# Patient Record
Sex: Female | Born: 1935
Health system: Southern US, Community
[De-identification: ages and names within clinical notes are randomized; demographics above are authoritative.]

## PROBLEM LIST (undated history)

## (undated) DIAGNOSIS — F32A Depression, unspecified: Secondary | ICD-10-CM

## (undated) DIAGNOSIS — F0390 Unspecified dementia without behavioral disturbance: Secondary | ICD-10-CM

## (undated) DIAGNOSIS — Z86711 Personal history of pulmonary embolism: Secondary | ICD-10-CM

## (undated) DIAGNOSIS — F419 Anxiety disorder, unspecified: Secondary | ICD-10-CM

## (undated) DIAGNOSIS — F329 Major depressive disorder, single episode, unspecified: Secondary | ICD-10-CM

## (undated) DIAGNOSIS — F05 Delirium due to known physiological condition: Secondary | ICD-10-CM

## (undated) DIAGNOSIS — E785 Hyperlipidemia, unspecified: Secondary | ICD-10-CM

## (undated) DIAGNOSIS — Z79899 Other long term (current) drug therapy: Secondary | ICD-10-CM

## (undated) DIAGNOSIS — M8580 Other specified disorders of bone density and structure, unspecified site: Secondary | ICD-10-CM

## (undated) DIAGNOSIS — D1803 Hemangioma of intra-abdominal structures: Secondary | ICD-10-CM

## (undated) DIAGNOSIS — I10 Essential (primary) hypertension: Secondary | ICD-10-CM

## (undated) DIAGNOSIS — R739 Hyperglycemia, unspecified: Secondary | ICD-10-CM

## (undated) DIAGNOSIS — K5790 Diverticulosis of intestine, part unspecified, without perforation or abscess without bleeding: Secondary | ICD-10-CM

## (undated) DIAGNOSIS — D649 Anemia, unspecified: Secondary | ICD-10-CM

## (undated) HISTORY — DX: Other long term (current) drug therapy: Z79.899

## (undated) HISTORY — DX: Diverticulosis of intestine, part unspecified, without perforation or abscess without bleeding: K57.90

## (undated) HISTORY — DX: Hemangioma of intra-abdominal structures: D18.03

## (undated) HISTORY — PX: OTHER SURGICAL HISTORY: SHX169

## (undated) HISTORY — DX: Depression, unspecified: F32.A

## (undated) HISTORY — PX: ABDOMINAL HYSTERECTOMY: SHX81

## (undated) HISTORY — DX: Other specified disorders of bone density and structure, unspecified site: M85.80

## (undated) HISTORY — DX: Hyperlipidemia, unspecified: E78.5

## (undated) HISTORY — DX: Essential (primary) hypertension: I10

## (undated) HISTORY — DX: Personal history of pulmonary embolism: Z86.711

## (undated) HISTORY — DX: Anemia, unspecified: D64.9

## (undated) HISTORY — DX: Major depressive disorder, single episode, unspecified: F32.9

---

## 2000-08-04 ENCOUNTER — Ambulatory Visit (HOSPITAL_COMMUNITY): Admission: RE | Admit: 2000-08-04 | Discharge: 2000-08-04 | Payer: Self-pay | Admitting: Gastroenterology

## 2002-07-21 ENCOUNTER — Encounter: Payer: Self-pay | Admitting: Emergency Medicine

## 2002-07-22 ENCOUNTER — Inpatient Hospital Stay (HOSPITAL_COMMUNITY): Admission: EM | Admit: 2002-07-22 | Discharge: 2002-07-23 | Payer: Self-pay | Admitting: Emergency Medicine

## 2003-10-08 DIAGNOSIS — M8580 Other specified disorders of bone density and structure, unspecified site: Secondary | ICD-10-CM

## 2003-10-08 HISTORY — DX: Other specified disorders of bone density and structure, unspecified site: M85.80

## 2003-11-17 DIAGNOSIS — Z86711 Personal history of pulmonary embolism: Secondary | ICD-10-CM

## 2003-11-17 HISTORY — DX: Personal history of pulmonary embolism: Z86.711

## 2004-09-05 ENCOUNTER — Emergency Department (HOSPITAL_COMMUNITY): Admission: EM | Admit: 2004-09-05 | Discharge: 2004-09-05 | Payer: Self-pay | Admitting: Emergency Medicine

## 2004-09-12 ENCOUNTER — Inpatient Hospital Stay (HOSPITAL_COMMUNITY): Admission: EM | Admit: 2004-09-12 | Discharge: 2004-09-15 | Payer: Self-pay | Admitting: Emergency Medicine

## 2005-04-14 ENCOUNTER — Emergency Department (HOSPITAL_COMMUNITY): Admission: EM | Admit: 2005-04-14 | Discharge: 2005-04-14 | Payer: Self-pay | Admitting: *Deleted

## 2005-06-15 ENCOUNTER — Ambulatory Visit: Payer: Self-pay | Admitting: Internal Medicine

## 2005-09-21 ENCOUNTER — Encounter (INDEPENDENT_AMBULATORY_CARE_PROVIDER_SITE_OTHER): Payer: Self-pay | Admitting: Internal Medicine

## 2005-09-22 ENCOUNTER — Ambulatory Visit: Payer: Self-pay | Admitting: Internal Medicine

## 2005-12-22 ENCOUNTER — Ambulatory Visit: Payer: Self-pay | Admitting: Internal Medicine

## 2006-01-01 ENCOUNTER — Encounter (INDEPENDENT_AMBULATORY_CARE_PROVIDER_SITE_OTHER): Payer: Self-pay | Admitting: Internal Medicine

## 2006-01-07 ENCOUNTER — Ambulatory Visit: Payer: Self-pay | Admitting: Internal Medicine

## 2006-09-23 DIAGNOSIS — M949 Disorder of cartilage, unspecified: Secondary | ICD-10-CM

## 2006-09-23 DIAGNOSIS — K5730 Diverticulosis of large intestine without perforation or abscess without bleeding: Secondary | ICD-10-CM | POA: Insufficient documentation

## 2006-09-23 DIAGNOSIS — M899 Disorder of bone, unspecified: Secondary | ICD-10-CM | POA: Insufficient documentation

## 2006-09-23 DIAGNOSIS — F329 Major depressive disorder, single episode, unspecified: Secondary | ICD-10-CM

## 2006-09-23 DIAGNOSIS — D1803 Hemangioma of intra-abdominal structures: Secondary | ICD-10-CM

## 2006-09-23 DIAGNOSIS — E785 Hyperlipidemia, unspecified: Secondary | ICD-10-CM | POA: Insufficient documentation

## 2006-09-23 DIAGNOSIS — D649 Anemia, unspecified: Secondary | ICD-10-CM

## 2006-09-23 DIAGNOSIS — Z86718 Personal history of other venous thrombosis and embolism: Secondary | ICD-10-CM | POA: Insufficient documentation

## 2006-09-23 DIAGNOSIS — I10 Essential (primary) hypertension: Secondary | ICD-10-CM | POA: Insufficient documentation

## 2007-08-18 ENCOUNTER — Emergency Department (HOSPITAL_COMMUNITY): Admission: EM | Admit: 2007-08-18 | Discharge: 2007-08-18 | Payer: Self-pay | Admitting: Emergency Medicine

## 2007-08-22 ENCOUNTER — Ambulatory Visit: Payer: Self-pay | Admitting: Orthopedic Surgery

## 2007-08-24 ENCOUNTER — Ambulatory Visit: Payer: Self-pay | Admitting: Orthopedic Surgery

## 2007-08-24 DIAGNOSIS — S52539A Colles' fracture of unspecified radius, initial encounter for closed fracture: Secondary | ICD-10-CM | POA: Insufficient documentation

## 2007-08-26 ENCOUNTER — Ambulatory Visit (HOSPITAL_COMMUNITY): Admission: RE | Admit: 2007-08-26 | Discharge: 2007-08-26 | Payer: Self-pay | Admitting: Orthopedic Surgery

## 2007-08-26 ENCOUNTER — Ambulatory Visit: Payer: Self-pay | Admitting: Orthopedic Surgery

## 2007-08-26 HISTORY — PX: OTHER SURGICAL HISTORY: SHX169

## 2007-08-30 ENCOUNTER — Ambulatory Visit: Payer: Self-pay | Admitting: Orthopedic Surgery

## 2007-09-06 ENCOUNTER — Ambulatory Visit: Payer: Self-pay | Admitting: Orthopedic Surgery

## 2007-09-06 DIAGNOSIS — M25539 Pain in unspecified wrist: Secondary | ICD-10-CM | POA: Insufficient documentation

## 2007-10-04 ENCOUNTER — Ambulatory Visit: Payer: Self-pay | Admitting: Orthopedic Surgery

## 2007-10-05 ENCOUNTER — Telehealth: Payer: Self-pay | Admitting: Orthopedic Surgery

## 2007-10-24 ENCOUNTER — Ambulatory Visit: Payer: Self-pay | Admitting: Orthopedic Surgery

## 2007-10-26 ENCOUNTER — Telehealth: Payer: Self-pay | Admitting: Orthopedic Surgery

## 2007-11-28 ENCOUNTER — Ambulatory Visit: Payer: Self-pay | Admitting: Orthopedic Surgery

## 2007-12-26 ENCOUNTER — Ambulatory Visit: Payer: Self-pay | Admitting: Orthopedic Surgery

## 2008-05-22 ENCOUNTER — Encounter: Admission: RE | Admit: 2008-05-22 | Discharge: 2008-05-22 | Payer: Self-pay | Admitting: Internal Medicine

## 2008-12-07 ENCOUNTER — Encounter: Payer: Self-pay | Admitting: Orthopedic Surgery

## 2008-12-07 ENCOUNTER — Emergency Department (HOSPITAL_COMMUNITY): Admission: EM | Admit: 2008-12-07 | Discharge: 2008-12-07 | Payer: Self-pay | Admitting: Emergency Medicine

## 2008-12-10 ENCOUNTER — Ambulatory Visit: Payer: Self-pay | Admitting: Orthopedic Surgery

## 2008-12-10 DIAGNOSIS — IMO0001 Reserved for inherently not codable concepts without codable children: Secondary | ICD-10-CM

## 2008-12-10 DIAGNOSIS — S52599A Other fractures of lower end of unspecified radius, initial encounter for closed fracture: Secondary | ICD-10-CM | POA: Insufficient documentation

## 2009-01-21 ENCOUNTER — Ambulatory Visit: Payer: Self-pay | Admitting: Orthopedic Surgery

## 2009-01-21 DIAGNOSIS — M25569 Pain in unspecified knee: Secondary | ICD-10-CM | POA: Insufficient documentation

## 2009-07-06 IMAGING — RF DG WRIST 2V*R*
1 series · 4 of 4 positions shown · non-contrast
Comparison: none

CLINICAL DATA: 81-year-old, right wrist fracture. 
 RIGHT WRIST ? 2 VIEW:

[Series 1161: run · 4 of 4 slices shown]
[im 1/4]
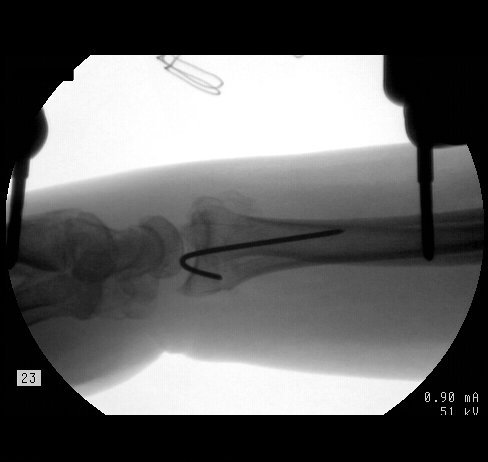
[im 2/4]
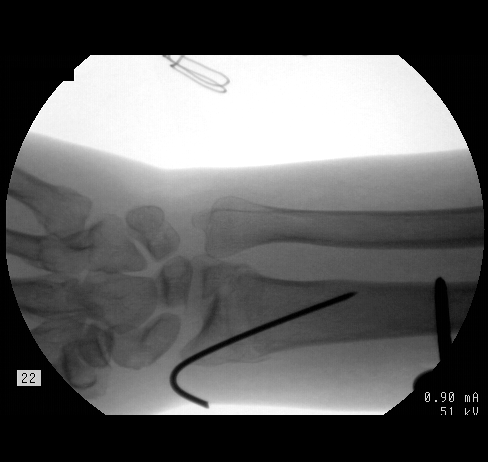
[im 3/4]
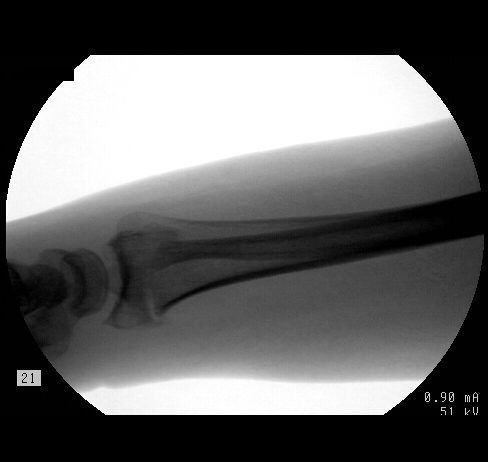
[im 4/4]
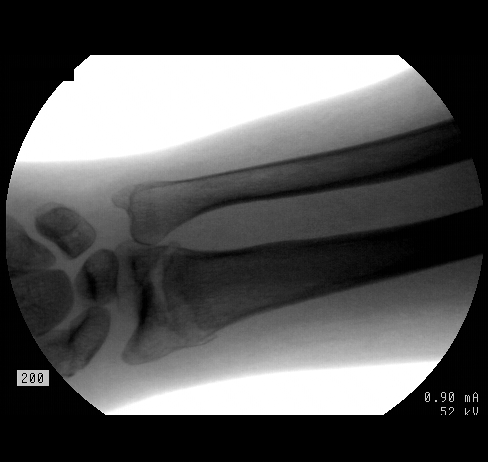

[4 of 4 positions shown; findings below may reference images not displayed]

FINDINGS: Four fluoroscopic spot images demonstrate placement of an external fixator with reduction of the distal radius fracture and subsequent placement of a smooth percutaneous pin through the radial styloid into the radial diaphysis.
IMPRESSION: External fixator in place with percutaneous pinning of a distal radius fracture.

## 2009-09-11 ENCOUNTER — Ambulatory Visit: Payer: Self-pay | Admitting: Orthopedic Surgery

## 2009-09-11 DIAGNOSIS — M1710 Unilateral primary osteoarthritis, unspecified knee: Secondary | ICD-10-CM

## 2009-12-16 ENCOUNTER — Ambulatory Visit: Payer: Self-pay | Admitting: Orthopedic Surgery

## 2010-04-16 ENCOUNTER — Ambulatory Visit: Payer: Self-pay | Admitting: Orthopedic Surgery

## 2010-08-07 ENCOUNTER — Ambulatory Visit: Payer: Self-pay | Admitting: Orthopedic Surgery

## 2010-11-06 ENCOUNTER — Ambulatory Visit: Payer: Self-pay | Admitting: Orthopedic Surgery

## 2010-12-04 ENCOUNTER — Emergency Department (HOSPITAL_COMMUNITY)
Admission: EM | Admit: 2010-12-04 | Discharge: 2010-12-04 | Payer: Self-pay | Source: Home / Self Care | Admitting: Emergency Medicine

## 2010-12-04 ENCOUNTER — Encounter: Payer: Self-pay | Admitting: Orthopedic Surgery

## 2010-12-08 ENCOUNTER — Ambulatory Visit
Admission: RE | Admit: 2010-12-08 | Discharge: 2010-12-08 | Payer: Self-pay | Source: Home / Self Care | Attending: Orthopedic Surgery | Admitting: Orthopedic Surgery

## 2010-12-08 ENCOUNTER — Encounter: Payer: Self-pay | Admitting: Orthopedic Surgery

## 2010-12-09 ENCOUNTER — Encounter (INDEPENDENT_AMBULATORY_CARE_PROVIDER_SITE_OTHER): Payer: Self-pay | Admitting: *Deleted

## 2010-12-10 LAB — BASIC METABOLIC PANEL
BUN: 17 mg/dL (ref 6–23)
CO2: 25 mEq/L (ref 19–32)
Chloride: 104 mEq/L (ref 96–112)
Creatinine, Ser: 0.92 mg/dL (ref 0.4–1.2)
Potassium: 3.9 mEq/L (ref 3.5–5.1)

## 2010-12-10 LAB — SURGICAL PCR SCREEN: MRSA, PCR: NEGATIVE

## 2010-12-12 ENCOUNTER — Ambulatory Visit (HOSPITAL_COMMUNITY)
Admission: RE | Admit: 2010-12-12 | Discharge: 2010-12-12 | Payer: Self-pay | Source: Home / Self Care | Attending: Orthopedic Surgery | Admitting: Orthopedic Surgery

## 2010-12-15 ENCOUNTER — Ambulatory Visit
Admission: RE | Admit: 2010-12-15 | Discharge: 2010-12-15 | Payer: Self-pay | Source: Home / Self Care | Attending: Orthopedic Surgery | Admitting: Orthopedic Surgery

## 2010-12-15 NOTE — Op Note (Addendum)
[name], [name]              ACCOUNT NO.:  [id number]  MEDICAL RECORD NO.:  [id number]          PATIENT TYPE:  AMB  LOCATION:  DAY                           FACILITY:  [facility]  PHYSICIAN:  Vickki Hearing, M.D.DATE OF BIRTH:  December 16, 1925  DATE OF PROCEDURE:  12/12/2010 DATE OF DISCHARGE:  12/12/2010                              OPERATIVE REPORT   DATE OF INJURY:  December 01, 2010, mechanism fall.  PREOPERATIVE DIAGNOSIS:  Closed fracture, right distal radius.  POSTOPERATIVE DIAGNOSIS:  Closed fracture, right distal radius.  PROCEDURE:  Open treatment and internal fixation, right distal radius.  SURGEON:  Vickki Hearing, MD  ASSISTED BY:  [place].  ANESTHETIC:  General.  OPERATIVE FINDINGS:  Previous malunited distal radius fracture, healed with shortening and loss of some of the volar tilt, refractured.  TOURNIQUET TIME:  80 minutes.  TOURNIQUET PRESSURE:  250 mmHg.  PROCEDURE IN DETAIL:  An 77 year old female identified in the preop area, right wrist marked as the surgical site and countersigned chart updated.  The patient taken to surgery, given a gram of Ancef, underwent general __________  intubation.  After prep and drape, time-out was executed.  Limb was then exsanguinated with a 4-inch Esmarch, tourniquet was elevated to 250 mmHg.  The arm was placed in supination.  The volar incision was made over the FCR.  Subcutaneous tissue was divided.  Flexor tendon sheath was incised.  FCR tendon was retracted.  Blunt dissection was carried out in the interval between the flexor of the thumb and the pronator teres.  Scar tissue was noted.  The fracture was noted, opened, cleaned, and then a reduction was performed.  A K-wire was used to stabilize reduction.  We did not attempt restoration of normal anatomy as there was too much contracture.  We treated her with reduction to her previous anatomy after this first fracture.  This left some shortening,  but gave reduction to the previous state.  Note, prior to the open reduction, a closed manipulation was performed and it was noted at that time that the fracture would not come back out to length and that the fracture had healed in a shortened position previously.  The DVR plate from the hand innovation set was used for fixation.  Contouring of the plate was made to account for the loss of volar tilt.  The 2/5 drill bit was used to place the first screw in the oblong hole and then starting on the ulnar side and proximal row.  The 2/0 drill bit was used to drill holes and then measuring device was placed.  The guides were removed and the screws were placed sequentially from proximal ulnar to distal radial.  This gave excellent fixation.  Radiographs confirmed reduction of the fracture to its previous state.  The proximal hole on the plate was then drilled off as well as the distal hole.  The most proximal of the shaft screw were removed, however.  Final radiographs confirmed reduction and adequate screw fixation. The forearm was rotated, the wrist was flexed and extended and the fracture fixation was stable.  Autograft bone was used after  irrigation in the fracture site.  A 2-0 Monocryl was used to repair the step cut in the brachioradialis which was released to assist in the reduction and then 2-0 Monocryl sutures were placed in the subcu followed by staples and injection of 20 mL of Marcaine with epinephrine.  A sugar-tong splint was applied and the patient was extubated and taken to recovery room in stable condition.  She is scheduled for followup on Monday.  She is discharged with: 1. Norco 5 mg with 325 mg of Tylenol 1-2 tablets q.4 p.r.n. for pain. 2. She is also on metoprolol 50 mg daily. 3. Simvastatin 20 mg daily. 4. Xanax 1 mg q. 4 as needed. 5. Losartan/hydrochlorothiazide 50/12.5 mg daily.     Vickki Hearing, M.D.     SEH/MEDQ  D:  12/12/2010  T:   12/13/2010  Job:  [id number]  Electronically Signed by Fuller Canada M.D. on 12/15/2010 04:43:32 PM

## 2010-12-15 NOTE — Discharge Summary (Addendum)
  [name], [name]              ACCOUNT NO.:  [id number]  MEDICAL RECORD NO.:  [id number]          PATIENT TYPE:  AMB  LOCATION:  DAY                           FACILITY:  [facility]  PHYSICIAN:  Vickki Hearing, M.D.DATE OF BIRTH:  September 07, 1926  DATE OF ADMISSION:  12/12/2010 DATE OF DISCHARGE:  LH                         DISCHARGE SUMMARY-REFERRING   CHIEF COMPLAINT:  Pain, right wrist.  HISTORY:  This is an 77 year old female with multiple medical problems with history of previous wrist fracture, left wrist, treated with cast, presents now with right wrist fracture, moderate to severe throbbing, aching pain associated with swelling, but no numbness or tingling, status post fall December 01, 2010.  X-rays were done at [facility] on the same day.  The patient presented to the office on December 08, 2010, and was scheduled for surgery.  The patient and her son were given the options of nonoperative treatment.  MEDICATIONS:  Include metoprolol, benazepril, simvastatin, hydrocodone 5 mg, currently not taking, Tylenol.  ALLERGIES:  ERYTHROMYCIN.  PAST MEDICAL HISTORY: 1. Depression. 2. Diverticulosis. 3. Hyperlipidemia. 4. Hypertension. 5. History of pulmonary embolism. 6. Hepatic hemangioma. 7. Normocytic anemia. 8. Osteopenia.  PAST SURGICAL HISTORY: 1. Hysterectomy. 2. Ten vaginal deliveries. 3. Fibroid tumor on the shoulder. 4. Right shoulder surgery. 5. Right closed reduction, percutaneous pinning, and external fixation     in October 2008.  FAMILY HISTORY:  Cancer and diabetes.  SOCIAL HISTORY:  The patient currently lives with her son.  MEDICAL REVIEW OF SYSTEMS:  Negative for cardiovascular, respiratory, GI, GU, neuro, endocrine, skin, ENT, immunologic, hematologic, or constitutional problems.  Denies nervousness, anxiety, or hallucinations.  Does complain of depression.  PHYSICAL EXAMINATION:  GENERAL:  Well-developed, well-nourished  female, normal grooming and hygiene.  No deformity and normal body habitus. MUSCULOSKELETAL:  Normal pulses.  No edema, no erythema, no tenderness. LYMPH NODES:  Normal. SKIN:  No rashes, no lesions, no open sores. NEUROLOGIC:  Normal coordination, reflexes, and sensation in her extremities.  She is awake, alert and oriented x3.  Mood and affect are normal.  Gait is slow and labored. EXTREMITIES:  Right upper extremity with deformity and swelling. Sensation is normal in the fingertips with normal color.  Arm is left in splint for comfort and pain control.  Lower extremities have mild deformities from arthritis.  Muscle tone is normal.  No joint subluxations are seen.  No swelling is seen. VITAL SIGNS:  Will be recorded on admission.  IMPRESSION:  X-rays were done at the hospital.  She has an impacted comminuted right distal radius fracture with some overriding and no evidence of dislocation.  PLAN:  Open treatment internal fixation, right wrist with a DVR volar plate.     Vickki Hearing, M.D.     SEH/MEDQ  D:  12/11/2010  T:  12/11/2010  Job:  [id number]  Electronically Signed by Fuller Canada M.D. on 12/15/2010 04:43:26 PM

## 2010-12-16 NOTE — Assessment & Plan Note (Signed)
Summary: BILAT KNEE PAIN/REQ'S INJEC/MAY NEED NEW XR'S/MEDICARE/CAF   Visit Type:  Follow-up Primary Provider:  Dr. Renae Gloss   CC:  bilateral knee pain.  History of Present Illness: I saw [name] in the office today for a 3 MONTH followup visit.  She is a 77 years old woman with the complaint of:  BILATERAL KNEES pain as she carries a diagnosis of osteoarthritis.  We have discussed surgery in the past but she would rather try other measures  Received injection right knee January of this year, today is requesting injections for both knees.  Meds: Simvastatin, Benazeprol/HCTZ, D3, Metoprolol, Xanax  Right knee injection lasted 3 months.  2 injections were given one in each knee using the following technique:  Verbal consent was obtained. The knee was prepped with alcohol and ethyl chloride. 1 cc of depomedrol 40mg /cc and 4 cc of lidocaine 1% was injected. there were no complications.    Allergies: 1)  ! Erythromycin   Impression & Recommendations:  Problem # 1:  KNEE, ARTHRITIS, DEGEN./OSTEO (ICD-715.96) Assessment Deteriorated  Her updated medication list for this problem includes:    Aspirin 81 Mg Tbec (Aspirin) .Marland Kitchen... Take 1 tablet by mouth once a day    Vicodin 5-500 Mg Tabs (Hydrocodone-acetaminophen)    Norco 5-325 Mg Tabs (Hydrocodone-acetaminophen) .Marland Kitchen... 1 by mouth q4 as needed    Tylenol With Codeine #3 300-30 Mg Tabs (Acetaminophen-codeine) .Marland Kitchen... 1 q 6 as needed pain  Orders: Joint Aspirate / Injection, Large (20610) Depo- Medrol 40mg  (J1030)  Medications Added to Medication List This Visit: 1)  Tylenol With Codeine #3 300-30 Mg Tabs (Acetaminophen-codeine) .Marland Kitchen.. 1 q 6 as needed pain  Patient Instructions: 1)  You have received an injection of cortisone today. You may experience increased pain at the injection site. Apply ice pack to the area for 20 minutes every 2 hours and take 2 xtra strength tylenol every 8 hours. This increased pain will usually resolve  in 24 hours. The injection will take effect in 3-10 days.  2)  we've added braces and pain medication as needed  3)  return in 3 months  Prescriptions: TYLENOL WITH CODEINE #3 300-30 MG TABS (ACETAMINOPHEN-CODEINE) 1 Q 6 as needed PAIN  #56 x 5   Entered and Authorized by:   Fuller Canada MD   Signed by:   Fuller Canada MD on 04/16/2010   Method used:   Print then Give to Patient   RxID:   [id number]

## 2010-12-16 NOTE — Assessment & Plan Note (Signed)
Summary: 3 MO FOL/UP AFTER BRACES & INJECTION/MEDICARE/BSF   Visit Type:  Follow-up Primary Provider:  Dr. Renae Gloss   CC:  bilateral knee pain.  History of Present Illness: I saw [name] in the office today for a 3 month followup visit.  She is a 77 years old woman with the complaint of:  bilateral knee pain   Follow up after injections and braces.  Meds: Simvastatin, Benazeprol/HCTZ, D3, Metoprolol, Xanax, Tylenol With Codeine #3 300-30 Mg Tabs (Acetaminophen-codeine) .Marland Kitchen.. 1 q 6 as needed pain.   She would like to have repeat injections in both knees, seem to help pretty well last time  Bilateral knee joint injections were done Verbal consent was obtained. The knee was prepped with alcohol and ethyl chloride. 1 cc of depomedrol 40mg /cc and 4 cc of lidocaine 1% was injected. there were no complications.- L   Verbal consent was obtained. The knee was prepped with alcohol and ethyl chloride. 1 cc of depomedrol 40mg /cc and 4 cc of lidocaine 1% was injected. there were no complications. -R   Allergies: 1)  ! Erythromycin   Impression & Recommendations:  Problem # 1:  KNEE, ARTHRITIS, DEGEN./OSTEO (ICD-715.96) Assessment Deteriorated  Her updated medication list for this problem includes:    Aspirin 81 Mg Tbec (Aspirin) .Marland Kitchen... Take 1 tablet by mouth once a day    Vicodin 5-500 Mg Tabs (Hydrocodone-acetaminophen)    Norco 5-325 Mg Tabs (Hydrocodone-acetaminophen) .Marland Kitchen... 1 by mouth q4 as needed    Tylenol With Codeine #3 300-30 Mg Tabs (Acetaminophen-codeine) .Marland Kitchen... 1 q 6 as needed pain  Orders: Joint Aspirate / Injection, Large (20610) Depo- Medrol 40mg  (J1030)  Problem # 2:  KNEE PAIN (JYN-829.56) Assessment: Deteriorated  Her updated medication list for this problem includes:    Aspirin 81 Mg Tbec (Aspirin) .Marland Kitchen... Take 1 tablet by mouth once a day    Vicodin 5-500 Mg Tabs (Hydrocodone-acetaminophen)    Norco 5-325 Mg Tabs (Hydrocodone-acetaminophen) .Marland Kitchen... 1 by mouth q4  as needed    Tylenol With Codeine #3 300-30 Mg Tabs (Acetaminophen-codeine) .Marland Kitchen... 1 q 6 as needed pain  Orders: Joint Aspirate / Injection, Large (20610) Depo- Medrol 40mg  (J1030)  Patient Instructions: 1)  Please schedule a follow-up appointment in 3 months. 2)  You have received an injection of cortisone today. You may experience increased pain at the injection site. Apply ice pack to the area for 20 minutes every 2 hours and take 2 xtra strength tylenol every 8 hours. This increased pain will usually resolve in 24 hours. The injection will take effect in 3-10 days.

## 2010-12-16 NOTE — Assessment & Plan Note (Signed)
Summary: 3 M RE-CK KNEES/POSS INJEC/MEDICARE/CAF   Visit Type:  Follow-up  CC:  bilateral knees.  History of Present Illness: I saw [name] in the office today for a 3 MONTH followup visit.  She is a 77 years old woman with the complaint of:  BILATERAL KNEES.  Bilateral osteoarthritis of the knee received an injection last time had an x-ray in March of 2010 of both knees.  Comes in today with her RIGHT knee hurting.  She had a giving out episode.  The strength in the RIGHT knee however on extension testing is normal, there is no swelling there is some medial and lateral joint line tenderness  Impression pain from osteoarthritis plan injection RIGHT knee  Knee injection Verbal consent was obtained. The knee was prepped with alcohol and ethyl chloride. 1 cc of depomedrol 40mg /cc and 4 cc of lidocaine 1% was injected. there were no complications.      Allergies: 1)  ! Erythromycin   Impression & Recommendations:  Problem # 1:  KNEE, ARTHRITIS, DEGEN./OSTEO (ICD-715.96) Assessment Deteriorated  Her updated medication list for this problem includes:    Aspirin 81 Mg Tbec (Aspirin) .Marland Kitchen... Take 1 tablet by mouth once a day    Vicodin 5-500 Mg Tabs (Hydrocodone-acetaminophen)    Norco 5-325 Mg Tabs (Hydrocodone-acetaminophen) .Marland Kitchen... 1 by mouth q4 as needed  Orders: Est. Patient Level II (16109) Joint Aspirate / Injection, Large (20610) Depo- Medrol 40mg  (J1030)  Problem # 2:  KNEE PAIN (UEA-540.98) Assessment: Deteriorated  Her updated medication list for this problem includes:    Aspirin 81 Mg Tbec (Aspirin) .Marland Kitchen... Take 1 tablet by mouth once a day    Vicodin 5-500 Mg Tabs (Hydrocodone-acetaminophen)    Norco 5-325 Mg Tabs (Hydrocodone-acetaminophen) .Marland Kitchen... 1 by mouth q4 as needed  Orders: Est. Patient Level II (11914)  Patient Instructions: 1)  You have received an injection of cortisone today. You may experience increased pain at the injection site. Apply ice pack  to the area for 20 minutes every 2 hours and take 2 xtra strength tylenol every 8 hours. This increased pain will usually resolve in 24 hours. The injection will take effect in 3-10 days.  2)  Please schedule a follow-up appointment as needed.

## 2010-12-18 NOTE — Assessment & Plan Note (Signed)
Summary: 3 M RE-CK KNEES/POSS INJEC/MEDICARE/CAF  CC: recheck knees   Visit Type:  Follow-up Primary [name]:  Dr. Renae Gloss   CC:  recheck knees.  History of Present Illness: I saw [name] in the office today for a 3 month followup visit.  She is a 77 years old woman with the complaint of:  bilateral knee pain   Follow up after injections and braces.  Meds: Simvastatin, Benazeprol/HCTZ, D3, Metoprolol, Xanax, Tylenol With Codeine #3 300-30 Mg Tabs (Acetaminophen-codeine) .Marland Kitchen.. 1 q 6 as needed pain.  [id number] bilateral knees.  Treatment injections, and braces.  Medications as above.  Complaints 4/10 pain today scheduled for repeat injections       Allergies: 1)  ! Erythromycin   Impression & Recommendations:  Problem # 1:  KNEE, ARTHRITIS, DEGEN./OSTEO (JYN-829.56) Assessment Comment Only  Verbal consent was obtained. The left knee and then the right knee was prepped with alcohol and ethyl chloride. 1 cc of depomedrol 40mg /cc and 4 cc of lidocaine 1% was injected. there were no complications.  Her updated medication list for this problem includes:    Aspirin 81 Mg Tbec (Aspirin) .Marland Kitchen... Take 1 tablet by mouth once a day    Vicodin 5-500 Mg Tabs (Hydrocodone-acetaminophen)    Norco 5-325 Mg Tabs (Hydrocodone-acetaminophen) .Marland Kitchen... 1 by mouth q4 as needed    Tylenol With Codeine #3 300-30 Mg Tabs (Acetaminophen-codeine) .Marland Kitchen... 1 q 6 as needed pain  Orders: Joint Aspirate / Injection, Large (20610) Depo- Medrol 40mg  (J1030)  Patient Instructions: 1)  You have received an injection of cortisone today. You may experience increased pain at the injection site. Apply ice pack to the area for 20 minutes every 2 hours and take 2 xtra strength tylenol every 8 hours. This increased pain will usually resolve in 24 hours. The injection will take effect in 3-10 days.  2)  Please schedule a follow-up appointment in 3 months. Prescriptions: TYLENOL WITH CODEINE #3  300-30 MG TABS (ACETAMINOPHEN-CODEINE) 1 Q 6 as needed PAIN  #84 x 3   Entered and Authorized by:   Fuller Canada MD   Signed by:   Fuller Canada MD on 11/06/2010   Method used:   Print then Give to Patient   RxID:   [id number]    Orders Added: 1)  Joint Aspirate / Injection, Large [20610] 2)  Depo- Medrol 40mg  [J1030]

## 2010-12-18 NOTE — Letter (Signed)
Summary: surgery order RTwrist scheduled 12/12/10  surgery order RTwrist scheduled 12/12/10   Imported By: Cammie Sickle 12/09/2010 19:54:37  _____________________________________________________________________  External Attachment:    Type:   Image     Comment:   External Document

## 2010-12-18 NOTE — Miscellaneous (Signed)
Summary: No pre-authorization required for out-patient surgery  Clinical Lists Changes  RE: Outpatient surgery RT wrist (distal radiius) scheduled 12/12/10 at [facility], no pre-authorization required per Medicare guidelines.

## 2010-12-18 NOTE — Letter (Signed)
Summary: History form  History form   Imported By: Jacklynn Ganong 12/09/2010 14:24:00  _____________________________________________________________________  External Attachment:    Type:   Image     Comment:   External Document

## 2010-12-18 NOTE — Assessment & Plan Note (Signed)
Summary: NEW PROB/[facility] ER FOL/UP FX RT WRIST/XRAY [facility] 12/04/10/MEDICARE/CAF   Visit Type:  new problem Referring Provider:  [facility] Primary Provider:  Dr. Renae Gloss   CC:  right wrist fracture.  History of Present Illness: I saw [name] in the office today for an initial visit.  She is a 77 years old woman with the complaint of:  right wrist fracture  DOI 12-01-10. Fall.  Xrays at [facility] on 12-04-10  Medications: Metoprolol, Benazepril, Simvastatin, Hydrocodone 5 mg. from ER, did not fill because could not afford, has been taking Tylenol.  Allergies: 1)  ! Erythromycin  Past History:  Past Medical History: Last updated: 09/23/2006 Depression Diverticulosis, colon Hyperlipidemia Hypertension Pulmonary embolism, hx of Polypharmacy Hepatic hemangioma Normocytic anemia Osteopenia (DXA 10/08/2003)  Past Surgical History: Last updated: 08/30/2007 Hysterectomy Vaginal deliveries x 10 Fibroid tumor on shoulder RT Arm/Shoulder surgery RT Closed Reduction Percutaneous Pins Ext Fixator 08/26/07-Dr Romeo Apple  Family History: FH of Cancer:  Family History of Diabetes  Social History: lives with her son   Review of Systems Psychiatric:  Complains of depression; denies nervousness, anxiety, and hallucinations.  The review of systems is negative for Constitutional, Cardiovascular, Respiratory, Gastrointestinal, Genitourinary, Neurologic, Musculoskeletal, Endocrine, Skin, HEENT, Immunology, and Hemoatologic.  Physical Exam  Additional Exam:  GEN: well developed, well nourished, normal grooming and hygiene, no deformity and normal body habitus.   CDV: pulses are normal, no edema, no erythema. no tenderness  Lymph: normal lymph nodes   Skin: no rashes, skin lesions or open sores   NEURO: normal coordination, reflexes, sensation.   Psyche: awake, alert and oriented. Mood normal   Gait: slow and labored   Inspection: RIGHT upper extremity, deformity with swelling. We  cannot test range of motion and strength and stability of the wrist joint. The splint was Done to stabilize the fracture.  Sensation was normal and the fingertips. Color was normal in the fingertips.  Her lower extremities have mild deformities from osteoarthritis. Muscle tone is normal. No joint subluxations are seen. No swelling is seen.      Impression & Recommendations:  Problem # 1:  FRACTURE, RADIUS, DISTAL (ICD-813.42) Assessment New The x-rays were done at [facility]. The report and the films have been reviewed. read as show Impaction type fracture of the distal right radius with some override, no evidence of dislocation.  However, I think it shows more of a proximal impaction of the metaphyseal diaphyseal region of the distal radius with shortening. Some dorsal shift of about 50%. No real articular surface angulation. There may be an intra-articular fragment or fracture line, just at the radial styloid. Radial inclination. Also has been lost. This will most likely require some type of surgical treatment. If the family agrees to that.   Informed consent process: I have discussed the procedure with the patient. I have answered their questions. The risks of bleeding, infection, nerve and vascualr injury have been discussed. The diagnosis and reason for surgery have been explained. The patient demonstrates understanding of this discussion. Specific to this procedure risks include: failure of fixation, wrist stiffness, wrist, pain, especially on the ulnar side of the joint. Offered surgery vs internal fixation she chose surgery with her son's approval  OTIF with volar plate (DVR) right wrist   Other Orders: Est. Patient Level IV (69629)   Orders Added: 1)  Est. Patient Level IV [52841]

## 2010-12-24 ENCOUNTER — Encounter: Payer: Self-pay | Admitting: Orthopedic Surgery

## 2010-12-24 ENCOUNTER — Ambulatory Visit (INDEPENDENT_AMBULATORY_CARE_PROVIDER_SITE_OTHER): Payer: Medicare Other | Admitting: Orthopedic Surgery

## 2010-12-24 DIAGNOSIS — S52599A Other fractures of lower end of unspecified radius, initial encounter for closed fracture: Secondary | ICD-10-CM

## 2010-12-24 DIAGNOSIS — S42309D Unspecified fracture of shaft of humerus, unspecified arm, subsequent encounter for fracture with routine healing: Secondary | ICD-10-CM

## 2010-12-24 NOTE — Assessment & Plan Note (Signed)
Summary: POST OP 1 RT WRIST1/19/12 MEDICARE/[facility]   Visit Type:  post op Referring Provider:  [facility] Primary Provider:  Dr. Renae Gloss   CC:  post op right wrist.  History of Present Illness: I saw [name] in the office today for a followup visit.  She is a 77 years old woman with the complaint of:  post op right wrist.  Post op #1  DOS 12-12-10.  Procedure: Open treatment and internal fixation, right distal radius.  Medications: Norco 5 mg  Complaints: She is having pain, pain medicine is helping some. She wants a shot in her knee.  Her RIGHT upper extremity. Incision looks very nice. Her swelling is minimal. She is placed in a volar cock-up splint.  I also placed a shot in her RIGHT knee.    Allergies: 1)  ! Erythromycin   Impression & Recommendations:  Problem # 1:  FRACTURE, RADIUS, DISTAL (ICD-813.42)  Orders: Post-Op Check (57846)  Problem # 2:  AFTERCARE HEALING TRAUMATIC FRACTURE ARM UNSPEC (ICD-V54.10)  Orders: Post-Op Check (96295)  Problem # 3:  KNEE, ARTHRITIS, DEGEN./OSTEO (ICD-715.96)  Her updated medication list for this problem includes:    Aspirin 81 Mg Tbec (Aspirin) .Marland Kitchen... Take 1 tablet by mouth once a day    Vicodin 5-500 Mg Tabs (Hydrocodone-acetaminophen)    Norco 5-325 Mg Tabs (Hydrocodone-acetaminophen) .Marland Kitchen... 1 by mouth q4 as needed    Tylenol With Codeine #3 300-30 Mg Tabs (Acetaminophen-codeine) .Marland Kitchen... 1 q 6 as needed pain  Verbal consent was obtained. The RIGHT knee was prepped with alcohol and ethyl chloride. 1 cc of depomedrol 40mg /cc and 4 cc of lidocaine 1% was injected. there were no complications.  Orders: Joint Aspirate / Injection, Large (20610) Depo- Medrol 40mg  (M8413)  Patient Instructions: 1)  Feb 7, 0r 8 for staples out and xrays    Orders Added: 1)  Post-Op Check [99024] 2)  Joint Aspirate / Injection, Large [20610] 3)  Depo- Medrol 40mg  [J1030]

## 2011-01-01 NOTE — Assessment & Plan Note (Signed)
Summary: staples out/xr  post op # 2 surg 12/12/10/mcr/bsf   Visit Type:  Follow-up Referring Provider:  [facility] Primary Provider:  Dr. Renae Gloss   CC:  post op wrist.  History of Present Illness: I saw [name] in the office today for a followup visit.  She is a 77 years old woman with the complaint of:  post op right wrist.  Post op visit #2, this is postoperative day #12  DOS 12-12-10.    Procedure: Open treatment and internal fixation, right distal radius.  Medications: Norco 5 mg, needs refill, helps.  Today is here for recheck and removes staples right wrist with xrays.  No new injuries.        Allergies: 1)  ! Erythromycin  Physical Exam  Additional Exam:   RIGHT wrist examination the incision looks great.  She has minimal swelling in her hand.  She did move all her fingers.     Impression & Recommendations:  Problem # 1:  AFTERCARE HEALING TRAUMATIC FRACTURE ARM UNSPEC (ICD-V54.10) Assessment Improved  Orders: Post-Op Check (93235) Wrist x-ray complete, minimum 3 views (57322) Short Arm Cast Elbow to Finger (02542)  Problem # 2:  FRACTURE, RADIUS, DISTAL (ICD-813.42) Assessment: Improved  x-rays were taken today of the RIGHT wrist  Fixation was in good position fracture is in good alignment  Recommend short arm cast which was applied in the office  Note on x-ray the patient had settling from a previous distal radius fracture which led to ulnar positive variance which is chronic.  Orders: Post-Op Check (70623) Wrist x-ray complete, minimum 3 views (76283) Short Arm Cast Elbow to Finger (15176)  Patient Instructions: 1)  Please schedule a follow-up appointment in 1 month. 2)  xr oop   Orders Added: 1)  Post-Op Check [99024] 2)  Wrist x-ray complete, minimum 3 views [73110] 3)  Short Arm Cast Elbow to Finger [29075]

## 2011-01-27 ENCOUNTER — Encounter: Payer: Self-pay | Admitting: Orthopedic Surgery

## 2011-01-27 ENCOUNTER — Ambulatory Visit (INDEPENDENT_AMBULATORY_CARE_PROVIDER_SITE_OTHER): Payer: Medicare Other | Admitting: Orthopedic Surgery

## 2011-01-27 DIAGNOSIS — S52599A Other fractures of lower end of unspecified radius, initial encounter for closed fracture: Secondary | ICD-10-CM

## 2011-01-27 DIAGNOSIS — S42309D Unspecified fracture of shaft of humerus, unspecified arm, subsequent encounter for fracture with routine healing: Secondary | ICD-10-CM

## 2011-02-03 NOTE — Assessment & Plan Note (Signed)
Summary: 1 m RE-CK/XRAY RT WRIST/MEDICARE/CAF   Visit Type:  Follow-up Referring Provider:  [facility] Primary Provider:  Dr. Renae Gloss   CC:  post op wrist.  History of Present Illness: I saw [name] in the office today for a followup visit.  She is a 77 years old woman with the complaint of:  post op right wrist.  Post op visit #3, this is postoperative day #12  DOS 12-12-10.  Procedure: Open treatment and internal fixation, right distal radius.  Medications: Norco 5 mg  Today is here for 1 month recheck XRAYS OOP    The patient's incision looks good.  She can open and close her hand with very little compromise and range of motion.  Passive range of motion of the wrist 20 extension 15 flexion  X-rays were obtained and show that the fracture is healing in the appropriate position and the hardware is stable  Recommend cockup splint home exercise program and a 6 week x-ray            Allergies: 1)  ! Erythromycin   Impression & Recommendations:  Problem # 1:  AFTERCARE HEALING TRAUMATIC FRACTURE ARM UNSPEC (ICD-V54.10)  separately identifiable x-ray report AP lateral modified and oblique RIGHT wrist  Plate fixation noted overall alignment acceptable with slight shortening noted from a previous fracture.  Hardware is in good position  Impression fracture healing and progression toward healing is noted in the RIGHT wrist  Orders: Post-Op Check (04540)  Problem # 2:  FRACTURE, RADIUS, DISTAL (ICD-813.42)  Orders: Post-Op Check (98119) Wrist x-ray complete, minimum 3 views (14782)  Patient Instructions: 1)  three times a day open and close the hand and move the wrist up and down  2)  x-rays left wrist in 6 weeks   Orders Added: 1)  Post-Op Check [99024] 2)  Wrist x-ray complete, minimum 3 views [73110]

## 2011-02-04 ENCOUNTER — Encounter: Payer: Self-pay | Admitting: Orthopedic Surgery

## 2011-02-11 ENCOUNTER — Ambulatory Visit (INDEPENDENT_AMBULATORY_CARE_PROVIDER_SITE_OTHER): Payer: Medicare Other | Admitting: Orthopedic Surgery

## 2011-02-11 DIAGNOSIS — M1710 Unilateral primary osteoarthritis, unspecified knee: Secondary | ICD-10-CM

## 2011-02-11 DIAGNOSIS — M25569 Pain in unspecified knee: Secondary | ICD-10-CM

## 2011-02-11 MED ORDER — HYDROCODONE-ACETAMINOPHEN 5-325 MG PO TABS: 1.0000 | ORAL_TABLET | ORAL | Status: DC | PRN

## 2011-02-12 MED ORDER — METHYLPREDNISOLONE ACETATE 40 MG/ML IJ SUSP: 40.0000 mg | Freq: Once | INTRAMUSCULAR | Status: DC

## 2011-02-12 NOTE — Progress Notes (Signed)
Injection LEFT knee.  Consent was obtained.  Time out was taken   LEFT knee was injected with Depo-Medrol 40 mg plus lidocaine 1% 4 cc.  Knee was prepped with alcohol and anesthetized with ethyl chloride.  The injection was tolerated without complication.    Verbal consent was obtained   Time out completed   Under sterile conditions the right knee was injected with Depomedrol 40 mg / ml (1 ml) and lidocaine 1% (4 ml)  There were no complications    

## 2011-03-02 LAB — DIFFERENTIAL
Eosinophils Absolute: 0 10*3/uL (ref 0.0–0.7)
Eosinophils Relative: 0 % (ref 0–5)
Lymphocytes Relative: 15 % (ref 12–46)
Lymphs Abs: 1.4 10*3/uL (ref 0.7–4.0)
Monocytes Absolute: 0.9 10*3/uL (ref 0.1–1.0)

## 2011-03-02 LAB — BASIC METABOLIC PANEL
BUN: 20 mg/dL (ref 6–23)
Chloride: 106 mEq/L (ref 96–112)
GFR calc non Af Amer: >60 mL/min (ref >60)
Glucose, Bld: 85 mg/dL (ref 70–99)
Potassium: 3.7 mEq/L (ref 3.5–5.1)
Sodium: 141 mEq/L (ref 135–145)

## 2011-03-02 LAB — CBC
HCT: 32.1 % — ABNORMAL LOW (ref 36.0–46.0)
Hemoglobin: 10.6 g/dL — ABNORMAL LOW (ref 12.0–15.0)
MCV: 91.8 fL (ref 78.0–100.0)
Platelets: 199 10*3/uL (ref 150–400)
WBC: 9.1 10*3/uL (ref 4.0–10.5)

## 2011-03-10 ENCOUNTER — Ambulatory Visit (INDEPENDENT_AMBULATORY_CARE_PROVIDER_SITE_OTHER): Payer: Medicare Other | Admitting: Orthopedic Surgery

## 2011-03-10 DIAGNOSIS — S52599A Other fractures of lower end of unspecified radius, initial encounter for closed fracture: Secondary | ICD-10-CM

## 2011-03-10 DIAGNOSIS — S52509A Unspecified fracture of the lower end of unspecified radius, initial encounter for closed fracture: Secondary | ICD-10-CM

## 2011-03-10 NOTE — Progress Notes (Signed)
Separate identifiable x-ray report AP lateral and oblique x-rays of the RIGHT wrist for fracture followup  There is deformity of the wrist is chronic from her previous fracture.  The plate is in good position the fracture is healed in good position with ulnar prominence.

## 2011-03-10 NOTE — Progress Notes (Signed)
The patient is doing well.  She has good motion of her hand and wrist she has ulnar-positive variance.  X-rays show healing of the fracture.  Patient is discharged in terms of the wrist

## 2011-03-31 NOTE — Op Note (Signed)
[name], [name]              ACCOUNT NO.:  [id number]   MEDICAL RECORD NO.:  [id number]          PATIENT TYPE:  AMB   LOCATION:  DAY                           FACILITY:  [facility]   PHYSICIAN:  Vickki Hearing, M.D.DATE OF BIRTH:  07/29/1926   DATE OF PROCEDURE:  08/26/2007  DATE OF DISCHARGE:  08/26/2007                               OPERATIVE REPORT   PREOPERATIVE DIAGNOSIS:  Fracture of the right distal radius.   POSTOPERATIVE DIAGNOSIS:  Fracture of the right distal radius.   PROCEDURE:  Closed reduction, external fixation and percutaneous pinning  of the right distal radius.   SURGEON:  Vickki Hearing, M.D.   ANESTHETIC:  By block.   OPERATIVE FINDINGS:  A displaced distal radius fracture.   After proper identification of the patient, she was brought to the  operating room and her right arm was blocked via Bier technique.  Sterile prep and drape was performed.  Time-out was completed.   Closed reduction was done by manipulation under C-arm guidance and the  patient's arm was placed in the pronated position.  Percutaneous pinning  was performed.  We then made an incision over the second metacarpal as  well to distal third radius, divided subcutaneous tissue bluntly down to  bone, applied to half pins, checked them with the C-arm and found them  to be in good position.  We then distracted the wrist and applied the  fixator, checked AP and lateral films, found the fracture to be reduced  in good position.  We closed the incisions with 3-0 nylon, dressed the  arm sterilely and then released the tourniquet.  The patient was taken  to the recovery room in stable condition.      Vickki Hearing, M.D.  Electronically Signed     SEH/MEDQ  D:  09/14/2007  T:  09/15/2007  Job:  [id number]

## 2011-03-31 NOTE — H&P (Signed)
[name], MOW              ACCOUNT NO.:  [id number]   MEDICAL RECORD NO.:  [id number]          PATIENT TYPE:  AMB   LOCATION:  DAY                           FACILITY:  [facility]   PHYSICIAN:  Vickki Hearing, M.D.DATE OF BIRTH:  1926/11/05   DATE OF ADMISSION:  DATE OF DISCHARGE:  LH                              HISTORY & PHYSICAL   CHIEF COMPLAINT:  Right wrist pain, date of injury -- October 2.  The  patient complains of constant aching, throbbing pain in the right wrist.  She has a distal radius fracture; she needs surgery.   HISTORY OF PRESENT ILLNESS:  The patient has a significant medical  history.  She has had a hysterectomy.  She has had a fibroid tumor  removed from her shoulder.  She had surgery on her right arm/shoulder.  She has had a stent placed in her vena cava.  She has a history of heart  palpitations, hypertension, diverticulitis.  I have spoken to her  doctor, Dr. Renae Gloss, at [phone] and she has indicated the patient  should be able to have surgery.   FAMILY HISTORY:  The patient has a family history of arthritis, cancer  and diabetes.   SOCIAL HISTORY:  She is widowed.  She does not smoke or drink.   REVIEW OF SYSTEMS:  She does become short of breath with activity, no  chest pain.  She does have 4-pillow orthopnea.  She has GI nausea.  NEUROLOGIC:  Dizziness, tremor, unsteady gait.  MUSCULOSKELETAL:  Rheumatoid arthritis and joint pain.  PSYCHIATRIC:  Depression.  She has  poor vision.  She denies weight loss, kidney disease, thyroid problems,  goiter, diabetes, skin cancer, eczema or itching.  She denies allergies.   ALLERGIES:  She is allergic to ERYTHROMYCIN.   EDUCATIONAL LEVEL:  Grade 6.   PHYSICAL EXAMINATION:  VITAL SIGNS:  Weight is 151.  Pulse is 62,  respiratory rate is 18.  GENERAL:  Appearance was normal.  CARDIOVASCULAR:  Observation and palpation normal.  SKIN:  Normal.  NEUROLOGIC:  She was awake, alert and oriented x3.  Mood and  affect  normal.  Sensation normal.  EXTREMITIES:  Right upper extremity was in a splint.  Fingers could move  and wiggle fine, good capillary refill.   RADIOLOGIC FINDINGS:  X-rays show a comminuted, impacted distal radius  fracture, right wrist.   PLAN:  Closed reduction, external fixation and percutaneous pinning.   The patient understands she will wear the fixator for 8 weeks.  We  discussed the risks and benefits of the procedure as well as its  complications.   MEDICATIONS:  1. Alprazolam 1 mg.  2. Simvastatin 20 mg.  3. Benazepril/hydrochlorothiazide 10 mg.  4. Metoprolol 50 mg.  5. Patanol for the eye 0.1% ophthalmic solution.  6. Pataday 0.2% eye ophthalmic solution.  7. She takes Vicodin for pain.      Vickki Hearing, M.D.  Electronically Signed     SEH/MEDQ  D:  08/25/2007  T:  08/26/2007  Job:  [id number]   cc:   [facility]

## 2011-04-03 NOTE — H&P (Signed)
[name], [name]                          ACCOUNT NO.:  [id number]   MEDICAL RECORD NO.:  [id number]                   PATIENT TYPE:  INP   LOCATION:  3037                                 FACILITY:  [facility]   PHYSICIAN:  Wilson Singer, M.D.             DATE OF BIRTH:  [birth year]   DATE OF ADMISSION:  07/21/2002  DATE OF DISCHARGE:  07/23/2002                                HISTORY & PHYSICAL   HISTORY OF PRESENT ILLNESS:  This is a 77 year old lady who has a background  history of hypertension and hypercholesterolemia who now presents with  slurred speech and difficulty with walking.  She noticed at approximately 10  hours ago, after she had eaten at [facility], she had slurred  speech and since this time has had difficulty with gait.  However, on closer  questioning she says that although the speech problem is new, the difficulty  with walking has been present for the last two weeks.  Today, she is unable  to walk because she feels unsteady and tends to vere to one side or the  other, although she cannot be specific to which side.  She has had no double  vision or blurred vision.  She has had no specific focal limb weakness.  She  has not loss consciousness, although I was told by the family today when she  was attended to by the emergency medical technicians that she was in a very  drowsy state.  She was prescribed more Xanax medication yesterday by her  primary care physician, Dr. Andi Carlson, to refill on previous Xanax  medications.  She denies taking any Xanax medications today.  She normally  takes Xanax on a daily basis.   MEDICATIONS:  1. Lotensin HCT one tablet q.d.  2. Lopressor 25 mg b.i.d.  3. Lipitor 20 mg q.d.  4. Xanax, dose unknown, q.d.   ALLERGIES:  ERYTHROMYCIN.   SOCIAL HISTORY:  She is married and lives with her husband and two other  children.  She does not smoke and does not drink excessively.   FAMILY HISTORY:   Noncontributory.   REVIEW OF SYMPTOMS:  Apart from the symptoms mentioned above, there are no  other symptoms referable to the cardiovascular, respiratory,  gastrointestinal, musculoskeletal, dermatological, psychiatric, endocrine or  hematologic systems.   PHYSICAL EXAMINATION:  GENERAL:  She looks well.  She does not look toxic.  She is not clinically pale.  VITAL SIGNS:  Blood pressure 134/69, pulse 78 in sinus rhythm, respirations  12, temperature 97.5.  CARDIOVASCULAR:  Heart sounds are present.  No carotid bruits.  Jugular  venous pressure is not raised.  LUNGS:  Clear to auscultation and percussion.  Breath sounds are clear.  There are no crackles or wheezes.  ABDOMEN:  Soft, nontender with no hepatosplenomegaly.  NEUROLOGIC:  She is slightly drowsy, but oriented to place, time and person.  There are no  focalizing signs.  There are no cerebellar signs.  In  particular, she does not have altered sensation, there is no nystagmus,  plantars are downgoing.  She has 5/5 power in all her limbs.  Sensory  intact.   LABORATORY DATA AND X-RAY FINDINGS:  Hemoglobin 11, white blood cell count  slightly reduced at 2.6 with a lymphocyte predominance of 60%, platelets  209.  Sodium 139, potassium 3.5, BUN 16, creatinine 0.8, glucose 107.  Liver  enzymes are within normal limits.   CT scan of her brain was suggestive of chronic ischemic changes with  possible air in both sides, but this is unclear.   IMPRESSION:  1. Ataxia with slurred speech.  2. Hypertension.  3. Hypercholesterolemia.   PLAN:  This lady appears to have a syndrome more likely the picture of  excessive benzodiazepines such as Xanax as opposed to any cerebellar  pathology.  We will admit her to the hospital and observe.  If her symptoms  persist, then she will need MRI of the brain with particular attention to  the cerebellum.  We may consider neurological consultation also if she does  not improve.  Further  recommendations will depend on the patient's progress.                                                Wilson Singer, M.D.    NCG/MEDQ  D:  07/22/2002  T:  07/24/2002  Job:  [id number]   cc:   [name]. Renae Gloss, M.D.

## 2011-04-03 NOTE — Consult Note (Signed)
[name], [name]              ACCOUNT NO.:  [id number]   MEDICAL RECORD NO.:  [id number]          PATIENT TYPE:  INP   LOCATION:  5001                         FACILITY:  [facility]   PHYSICIAN:  Ollen Gross. Vernell Morgans, M.D. DATE OF BIRTH:  1926-11-06   DATE OF CONSULTATION:  09/13/2004  DATE OF DISCHARGE:                                   CONSULTATION   REASON FOR CONSULTATION:  [name] is a 77 year old black female who was  recently admitted to the hospital after syncopal episode and fall.  She has  been stable from a hemodynamic and pulmonary standpoint.  Her only complaint  is of some right-sided flank pain which she was having intermittently for  the last year prior to admission to the hospital.  She otherwise feels well  today.   REVIEW OF SYMPTOMS:  Her workup included a CT scan which showed both  pulmonary emboli and a large hemangioma of the right lobe of her liver.  She  otherwise denies nausea, vomiting, fevers, chills, chest pain, shortness of  breath, diarrhea, and dysuria.  The rest of the review of systems is  unremarkable.   PAST MEDICAL HISTORY:  1.  Significant for hypertension.  2.  Dyslipidemia.  3.  Diverticulosis.  4.  It sounds as though she has had a hemangioma of her liver known about      for at least 10 years.   PAST SURGICAL HISTORY:  Significant for hysterectomy.   MEDICATIONS:  Lotensin, Lopressor, and Lipitor.   ALLERGIES:  ERYTHROMYCIN.   SOCIAL HISTORY:  She denies use of alcohol or tobacco products.   FAMILY HISTORY:  Noncontributory.   PHYSICAL EXAMINATION:  VITAL SIGNS:  Temperature is 96.9, blood pressure  120/60, pulse of 82.  GENERAL:  She is a well-developed, well-nourished elderly black female in no  acute distress.  SKIN:  Warm and dry with no jaundice.  HEENT:  Her extraocular movements intact.  Pupils are equal, round, and  reactive to light.  Sclerae are nonicteric.  She does have a bruise  underneath her right eye.  LUNGS:   Clear bilaterally with no use of accessory respiratory muscles.  HEART:  Regular rate and rhythm with interpose in the left chest.  ABDOMEN:  Soft and nontender with no palpable masses or hepatosplenomegaly.  She does have a little bit of right flank tenderness but very minimal.  EXTREMITIES:  No clubbing, cyanosis, or edema.  Good strength in her arms  and legs.  NEUROLOGICAL:  She is alert and oriented x 3 with no evidence today of  anxiety or depression.   LABORATORY DATA:  On review of her CT scan, it was notable as described  above for pulmonary emboli and large hemangioma of the right lobe of the  liver.   ASSESSMENT/PLAN:  This is a 77 year old black female with acute pulmonary  emboli as well as a large hemangioma of the right lobe of her liver that has  probably been there for at least 10 years or more.  Because the hemangioma  of the liver does carry a risk of bleeding,  I think it would be prudent to  try to avoid anticoagulation if possible and would recommend placement of an  IVC filter.  I think the options for management of her hemangioma include  continued observation, embolization, or surgical resection.  Given her age  and comorbidities, I think for now continued observation would be the most  prudent.  I think when she is stable, I would recommend getting a second  opinion from nearby academics as to what they feel like would be most  appropriate as far as her liver goes.  We will continue to follow closely  with you and appreciate the opportunity to help care with this patient.       PST/MEDQ  D:  09/13/2004  T:  09/13/2004  Job:  [id number]

## 2011-04-03 NOTE — Discharge Summary (Signed)
[name], [name]              ACCOUNT NO.:  [id number]   MEDICAL RECORD NO.:  [id number]          PATIENT TYPE:  INP   LOCATION:  5001                         FACILITY:  [facility]   PHYSICIAN:  [name], D.O.    DATE OF BIRTH:  [birth year]   DATE OF ADMISSION:  09/11/2004  DATE OF DISCHARGE:                                 DISCHARGE SUMMARY   PRIMARY CARE PHYSICIAN:  [name], M.D.   CONSULTATIONS:  1.  [name]. [name], M.D., general surgery.  2.  [name], M.D., interventional radiology.   ADMISSION DIAGNOSES:  1.  Right-sided chest pain, status post fall.  2.  Urinary tract infection.   DISCHARGE DIAGNOSES:  1.  Acute pulmonary emboli found on CT scan of the chest, status post IVC      filter placement due to high risk for anticoagulation.  2.  Hemangioma incidentally found on CT scan evaluation for pulmonary      emboli.  This was a large right hepatic lobe 6 cm x 6 cm x 4 cm      hemangioma that was felt to be ectophytic in appearance and at risk for      hemorrhage.  After thorough discussion with the family and Dr. Carolynne Carlson from      general surgery, it is felt that she was at too high risk to be placed      on anticoagulation.  Therefore, it was elected to place an IVC filter,      which was done without complication on September 13, 2004, by      interventionalist, [name].  She underwent consultation with Dr. Carolynne Carlson      of general surgery who recommended three options for management,      including continued observation, embolization, or surgical resection.      It was felt that she would be appropriate at this time for the first      option;  however, it was recommended that she receive a second opinion      at the [facility], [place], or [place].  This is conveyed to      the family in addition to this patient's primary care physician in the      form of discharge summary.  3.  Urinary tract infection.  Status post initiation of Cipro this  admission.  To conclude on September 17, 2004, as the last dose.  4.  Persistent right-sided pain.  Suspected to be related to acute pulmonary      embolus.  The patient is being managed with oral narcotic analgesics.   DISCHARGE MEDICATIONS:  1.  Tylenol 650 mg p.o. q.4h. p.r.n. mild pain.  2.  Oxycodone 5 mg p.o. q.4h. p.r.n. moderate pain.  3.  Senokot one b.i.d. with instructions to hold for diarrhea.  4.  Lotensin as before.  5.  Lipitor as before.  6.  Lopressor 25 mg daily.  7.  She is instructed to hold her Lotensin if she develops persistent      dizziness or lightheadedness and check her blood pressure.  If this is      low, she should notify her primary care physician.   DISPOSITION:  [name] will follow up with her primary care physician  this week.  She should call for an appointment tomorrow which is Monday.  In  addition, have referral to [facility] or [facility] for evaluation of hepatic  hemangioma.   HISTORY OF PRESENT ILLNESS:  Please see H&P as dictated by Dr. Ricke Hey.  However, briefly, [name] is a pleasant 77 year old African-American  female who fell on September 05, 2004, apparently had a yard sale on that day.  She had gone outside around 6:30 to set out a few things in preparation.  While bending over to pick up something she states she blacked-out.  There  was no observer account, but she had some bruises on her face and she had  complaints of pain in her sides and chest.  Her son came downstairs and  found her which EMS was dispatched.  She was taken to [facility]  and discharged on the same day.  Since that time, she has complained of  right-sided chest pain exacerbated by deep inspiration.  She has not moved  much since the fall, and has spent quite a bit of time sitting about.   HOSPITAL COURSE:  She was admitted for further evaluation and management of  pain control.  Initial orders included a CT scan and angiogram of the chest  which  subsequently revealed an acute pulmonary embolus.  However, in  addition did reveal a large concerning hemangioma.  She was asked to be seen  by general surgery.  I spoke with Dr. Carolynne Carlson who made recommendations based on  the above findings.  IVC filter was placed by [name], and anticoagulation  was stopped.  Recommendations for outpatient second opinion from a surgeon  with regards to her large hepatic hemangioma was provided to the family.  At  this time, [name] is hemodynamically stable.  It is anticipated that  she may be suitable for discharge in the a.m. following reassessment by  discharging physician.       ESS/MEDQ  D:  09/14/2004  T:  09/14/2004  Job:  [id number]   cc:   [name], M.D.  [address]  [address]  [place]  [place] [address]  Fax: [phone]

## 2011-04-03 NOTE — H&P (Signed)
[name], [name]              ACCOUNT NO.:  [id number]   MEDICAL RECORD NO.:  [id number]          PATIENT TYPE:  INP   LOCATION:                               FACILITY:  [facility]   PHYSICIAN:  Isidor Holts, M.D.  DATE OF BIRTH:  07-27-1926   DATE OF ADMISSION:  09/12/2004  DATE OF DISCHARGE:                                HISTORY & PHYSICAL   PRIMARY CARE PHYSICIAN:  Dr. Andi Devon   CHIEF COMPLAINT:  Right-sided chest pain approximately one week duration,  also shortness of breath.   HISTORY OF PRESENT ILLNESS:  Essentially, as above.  This is a 77 year old  African-American female.  According to patient she fell on September 05, 2004.  On that day she had arranged a yard sale and so had gone outside about 6:30  a.m. to sort out a few things in preparation.  While bending over to pick up  something from the ground, she blacked out.  There is no observer account;  however, she sustained a bruised face and laid on the ground for  approximately 10 minutes.  There was no associated incontinence.  She,  according to her, hurt in the sides, chest, etc.  Son came down and found  her and called emergency medical services who took her to [facility] Emergency Room.  There, according to patient, she was evaluated but  injuries were ruled out and she was discharged on the same day.  Since then,  however, has had right-sided chest pain which is exacerbated by deep  inspiration.  She also has not been very mobile since her fall, spends a lot  of time sitting or lying around.   PAST MEDICAL HISTORY:  1.  Hypertension.  2.  Dyslipidemia.  3.  Diverticular disease.   MEDICATIONS:  1.  Lotensin.  Patient is not sure what the dose is.  2.  Lopressor 25 mg p.o. daily.  3.  Lipitor 20 mg p.o. daily, although patient is not very compliant.   ALLERGIES:  ERYTHROMYCIN.  This causes mood changes/euphoria?   SOCIAL HISTORY:  Patient was widowed in April of 2004.  She is a  nonsmoker.  Does not drink alcohol.  Has no history of drug abuse.   FAMILY HISTORY:  Noncontributory.   REVIEW OF SYSTEMS:  As per HPI and chief complaint, otherwise negative.   PHYSICAL EXAMINATION:  VITAL SIGNS:  Temperature 97.0, blood pressure  101/61, pulse rate 80, respiratory rate 12, pulse oximetry 98% on room air.  GENERAL:  Patient appears quite comfortable, alert, not short of breath at  rest, communicative, not in obvious acute distress.  Old bruising is noted  over the nasal bridge and right malar region.  HEENT:  No clinical pallor.  No jaundice.  No conjunctival injection.  Throat appears quite clear.  NECK:  Supple.  JVP not seen.  No palpable lymphadenopathy.  No palpable  goiter.  CHEST:  Bibasilar crackles.  No wheezes.  Patient has shotty localized  tenderness right lower ribs without any obvious bruising.  HEART:  Sounds 1 and 2 heard.  Normal.  Regular.  No murmurs.  ABDOMEN:  Full, soft, and nontender.  There is no palpable organomegaly, no  palpable masses.  Normal bowel sounds.  EXTREMITIES:  Lower extremity examination:  No pitting edema.  Palpable  peripheral pulses.  MUSCULOSKELETAL:  Full range of motion all joints.  No joint swelling,  deformity, or tenderness noted.  CENTRAL NERVOUS SYSTEM:  No focal neurologic deficits on gross examination.   INVESTIGATIONS:  CBC:  WBC 8.0, hemoglobin 10.6, hematocrit 30.8, platelets  245.  Electrolytes:  Sodium 138, potassium 3.5, chloride 103, CO2 26, BUN  15, creatinine 1.1, glucose 113.  Troponin I less than 0.05.  Urinalysis:  pH 6.5, specific gravity 1.013, leukocyte esterase moderate, wbc 7-10, rbc 0-  2, bacteria many.  Chest x-ray:  Minimal bibasilar atelectasis.   ASSESSMENT/PLAN:  1.  Right-sided chest pain.  This is most likely musculoskeletal.  Will have      to consider possible rib fracture secondary to fall.  Will arrange chest      x-ray rib series and treat meanwhile with NSAIDs.  In view of  decreased      mobility, however, and _________ character of pain, will also have to      rule out pulmonary embolism, although patient has no clinical deep      venous thrombosis on physical examination.  Will therefore arrange CT      angiogram.  2.  Hypertension.  This appears controlled.  Will monitor for now and treat      as appropriate.  3.  History of dyslipidemia.  4.  Urinary tract infection with positive urine sediment.  Will treat with      ciprofloxacin 500 mg p.o. b.i.d. for one week.  5.  History of fall with blackout approximately one week ago.  Patient will      need carotid duplex and possibly brain MRI in due course.  Possible      investigative modality might include tilt table testing, although given      time elapsed since symptoms, some of these investigations may be better      arranged on an outpatient basis.  Further management will depend on      clinical course.      Chri   CO/MEDQ  D:  09/12/2004  T:  09/12/2004  Job:  [id number]   cc:   Merlene Laughter. Renae Gloss, M.D.  [address]  [address]  [place]  [place] [address]  Fax: [phone]

## 2011-05-14 ENCOUNTER — Other Ambulatory Visit: Payer: Self-pay | Admitting: *Deleted

## 2011-05-14 DIAGNOSIS — M25569 Pain in unspecified knee: Secondary | ICD-10-CM

## 2011-05-14 MED ORDER — HYDROCODONE-ACETAMINOPHEN 5-325 MG PO TABS: 1.0000 | ORAL_TABLET | ORAL | Status: DC | PRN

## 2011-06-10 ENCOUNTER — Ambulatory Visit (INDEPENDENT_AMBULATORY_CARE_PROVIDER_SITE_OTHER): Payer: Medicare Other | Admitting: Orthopedic Surgery

## 2011-06-10 DIAGNOSIS — M1710 Unilateral primary osteoarthritis, unspecified knee: Secondary | ICD-10-CM

## 2011-06-10 DIAGNOSIS — IMO0002 Reserved for concepts with insufficient information to code with codable children: Secondary | ICD-10-CM

## 2011-06-10 MED ORDER — METHYLPREDNISOLONE ACETATE 40 MG/ML IJ SUSP: 40.0000 mg | Freq: Once | INTRAMUSCULAR | Status: DC

## 2011-06-10 NOTE — Progress Notes (Signed)
Scheduled for bilateral knee injections.  Verbal consent was obtained   Time out completed   Under sterile conditions the right knee was injected with Depomedrol 40 mg / ml (1 ml) and lidocaine 1% (4 ml)  There were no complications   Injection LEFT knee.  Consent was obtained.  Time out was taken   LEFT knee was injected with Depo-Medrol 40 mg plus lidocaine 1% 4 cc.  Knee was prepped with alcohol and anesthetized with ethyl chloride.  The injection was tolerated without complication.

## 2011-06-10 NOTE — Patient Instructions (Signed)
The patient is advised to apply ice or cold packs intermittently as needed to relieve pain.

## 2011-08-27 LAB — CBC
MCHC: 33.4
MCV: 88.8
Platelets: 279
RBC: 3.33 — ABNORMAL LOW
RDW: 13.1

## 2011-08-27 LAB — BASIC METABOLIC PANEL
BUN: 13
CO2: 27
Calcium: 8.9
Chloride: 107
Creatinine, Ser: 0.89
GFR calc Af Amer: >60
Glucose, Bld: 80

## 2011-09-10 ENCOUNTER — Ambulatory Visit (INDEPENDENT_AMBULATORY_CARE_PROVIDER_SITE_OTHER): Payer: Medicare Other | Admitting: Orthopedic Surgery

## 2011-09-10 ENCOUNTER — Encounter: Payer: Self-pay | Admitting: Orthopedic Surgery

## 2011-09-10 VITALS — BP 110/60 | Ht 67.0 in | Wt 150.4 lb

## 2011-09-10 DIAGNOSIS — M179 Osteoarthritis of knee, unspecified: Secondary | ICD-10-CM | POA: Insufficient documentation

## 2011-09-10 DIAGNOSIS — M25569 Pain in unspecified knee: Secondary | ICD-10-CM

## 2011-09-10 DIAGNOSIS — M1710 Unilateral primary osteoarthritis, unspecified knee: Secondary | ICD-10-CM

## 2011-09-10 MED ORDER — METHYLPREDNISOLONE ACETATE 40 MG/ML IJ SUSP: 40.0000 mg | Freq: Once | INTRAMUSCULAR | Status: DC

## 2011-09-10 MED ORDER — HYDROCODONE-ACETAMINOPHEN 5-325 MG PO TABS: 1.0000 | ORAL_TABLET | ORAL | Status: DC | PRN

## 2011-09-10 NOTE — Progress Notes (Signed)
Knee  Injection Procedure Note  Pre-operative Diagnosis: left knee oa  Post-operative Diagnosis: same  Indications: pain  Anesthesia: ethyl chloride   Procedure Details   Verbal consent was obtained for the procedure. Time out was completed.The joint was prepped with alcohol, followed by  Ethyl chloride spray and A 20 gauge needle was inserted into the knee via lateral approach; 4ml 1% lidocaine and 1 ml of depomedrol  was then injected into the joint . The needle was removed and the area cleansed and dressed.  Complications:  None; patient tolerated the procedure well. Knee  Injection Procedure Note  Pre-operative Diagnosis: right knee oa  Post-operative Diagnosis: same  Indications: pain  Anesthesia: ethyl chloride   Procedure Details   Verbal consent was obtained for the procedure. Time out was completed.The joint was prepped with alcohol, followed by  Ethyl chloride spray and A 20 gauge needle was inserted into the knee via lateral approach; 4ml 1% lidocaine and 1 ml of depomedrol  was then injected into the joint . The needle was removed and the area cleansed and dressed.  Complications:  None; patient tolerated the procedure well.  

## 2011-09-10 NOTE — Patient Instructions (Signed)
You have received a steroid shot. 15% of patients experience increased pain at the injection site with in the next 24 hours. This is best treated with ice and tylenol extra strength 2 tabs every 8 hours. If you are still having pain please call the office.    

## 2011-10-05 ENCOUNTER — Observation Stay (HOSPITAL_COMMUNITY)
Admission: EM | Admit: 2011-10-05 | Discharge: 2011-10-07 | DRG: 884 | Disposition: A | Discharge status: Home / Self Care | Payer: Medicare Other | Attending: Internal Medicine | Admitting: Internal Medicine

## 2011-10-05 ENCOUNTER — Encounter (HOSPITAL_COMMUNITY): Payer: Self-pay

## 2011-10-05 ENCOUNTER — Emergency Department (HOSPITAL_COMMUNITY): Payer: Medicare Other

## 2011-10-05 DIAGNOSIS — F0390 Unspecified dementia without behavioral disturbance: Principal | ICD-10-CM

## 2011-10-05 DIAGNOSIS — IMO0001 Reserved for inherently not codable concepts without codable children: Secondary | ICD-10-CM

## 2011-10-05 DIAGNOSIS — M25539 Pain in unspecified wrist: Secondary | ICD-10-CM

## 2011-10-05 DIAGNOSIS — K5730 Diverticulosis of large intestine without perforation or abscess without bleeding: Secondary | ICD-10-CM

## 2011-10-05 DIAGNOSIS — D1803 Hemangioma of intra-abdominal structures: Secondary | ICD-10-CM

## 2011-10-05 DIAGNOSIS — R410 Disorientation, unspecified: Secondary | ICD-10-CM | POA: Diagnosis present

## 2011-10-05 DIAGNOSIS — M1710 Unilateral primary osteoarthritis, unspecified knee: Secondary | ICD-10-CM | POA: Diagnosis present

## 2011-10-05 DIAGNOSIS — M1990 Unspecified osteoarthritis, unspecified site: Secondary | ICD-10-CM

## 2011-10-05 DIAGNOSIS — R269 Unspecified abnormalities of gait and mobility: Secondary | ICD-10-CM

## 2011-10-05 DIAGNOSIS — F19921 Other psychoactive substance use, unspecified with intoxication with delirium: Secondary | ICD-10-CM | POA: Diagnosis present

## 2011-10-05 DIAGNOSIS — E785 Hyperlipidemia, unspecified: Secondary | ICD-10-CM

## 2011-10-05 DIAGNOSIS — F3289 Other specified depressive episodes: Secondary | ICD-10-CM

## 2011-10-05 DIAGNOSIS — Z86718 Personal history of other venous thrombosis and embolism: Secondary | ICD-10-CM

## 2011-10-05 DIAGNOSIS — N179 Acute kidney failure, unspecified: Secondary | ICD-10-CM

## 2011-10-05 DIAGNOSIS — T380X5A Adverse effect of glucocorticoids and synthetic analogues, initial encounter: Secondary | ICD-10-CM | POA: Diagnosis present

## 2011-10-05 DIAGNOSIS — E860 Dehydration: Secondary | ICD-10-CM

## 2011-10-05 DIAGNOSIS — R739 Hyperglycemia, unspecified: Secondary | ICD-10-CM

## 2011-10-05 DIAGNOSIS — IMO0002 Reserved for concepts with insufficient information to code with codable children: Secondary | ICD-10-CM

## 2011-10-05 DIAGNOSIS — F4489 Other dissociative and conversion disorders: Secondary | ICD-10-CM

## 2011-10-05 DIAGNOSIS — M899 Disorder of bone, unspecified: Secondary | ICD-10-CM

## 2011-10-05 DIAGNOSIS — S52539A Colles' fracture of unspecified radius, initial encounter for closed fracture: Secondary | ICD-10-CM

## 2011-10-05 DIAGNOSIS — I10 Essential (primary) hypertension: Secondary | ICD-10-CM

## 2011-10-05 DIAGNOSIS — M179 Osteoarthritis of knee, unspecified: Secondary | ICD-10-CM

## 2011-10-05 DIAGNOSIS — F329 Major depressive disorder, single episode, unspecified: Secondary | ICD-10-CM

## 2011-10-05 DIAGNOSIS — D649 Anemia, unspecified: Secondary | ICD-10-CM

## 2011-10-05 DIAGNOSIS — R7309 Other abnormal glucose: Secondary | ICD-10-CM | POA: Diagnosis present

## 2011-10-05 DIAGNOSIS — M25569 Pain in unspecified knee: Secondary | ICD-10-CM

## 2011-10-05 DIAGNOSIS — E876 Hypokalemia: Secondary | ICD-10-CM

## 2011-10-05 DIAGNOSIS — M949 Disorder of cartilage, unspecified: Secondary | ICD-10-CM

## 2011-10-05 DIAGNOSIS — S52599A Other fractures of lower end of unspecified radius, initial encounter for closed fracture: Secondary | ICD-10-CM

## 2011-10-05 DIAGNOSIS — N289 Disorder of kidney and ureter, unspecified: Secondary | ICD-10-CM

## 2011-10-05 HISTORY — DX: Delirium due to known physiological condition: F05

## 2011-10-05 HISTORY — DX: Anxiety disorder, unspecified: F41.9

## 2011-10-05 HISTORY — DX: Hyperglycemia, unspecified: R73.9

## 2011-10-05 HISTORY — DX: Unspecified dementia without behavioral disturbance: F03.90

## 2011-10-05 LAB — CBC
Platelets: 266 10*3/uL (ref 150–400)
RDW: 13 % (ref 11.5–15.5)
WBC: 5.6 10*3/uL (ref 4.0–10.5)

## 2011-10-05 LAB — COMPREHENSIVE METABOLIC PANEL
ALT: 8 U/L (ref 0–35)
AST: 18 U/L (ref 0–37)
CO2: 22 mEq/L (ref 19–32)
Calcium: 10.2 mg/dL (ref 8.4–10.5)
Chloride: 101 mEq/L (ref 96–112)
GFR calc non Af Amer: 36 mL/min — ABNORMAL LOW (ref >90)
Sodium: 137 mEq/L (ref 135–145)

## 2011-10-05 LAB — DIFFERENTIAL
Basophils Absolute: 0 10*3/uL (ref 0.0–0.1)
Lymphocytes Relative: 33 % (ref 12–46)
Neutro Abs: 2.9 10*3/uL (ref 1.7–7.7)

## 2011-10-05 LAB — CARDIAC PANEL(CRET KIN+CKTOT+MB+TROPI)
Relative Index: 2.5 (ref 0.0–2.5)
Total CK: 147 U/L (ref 7–177)
Troponin I: <0.3 ng/mL (ref <0.30)

## 2011-10-05 LAB — URINALYSIS, ROUTINE W REFLEX MICROSCOPIC
Bilirubin Urine: NEGATIVE
Glucose, UA: NEGATIVE mg/dL
Specific Gravity, Urine: <1.005 — ABNORMAL LOW (ref 1.005–1.030)
Urobilinogen, UA: 0.2 mg/dL (ref 0.0–1.0)

## 2011-10-05 LAB — URINE MICROSCOPIC-ADD ON

## 2011-10-05 LAB — MAGNESIUM: Magnesium: 2 mg/dL (ref 1.5–2.5)

## 2011-10-05 MED ORDER — METOPROLOL SUCCINATE ER 50 MG PO TB24
50.0000 mg | ORAL_TABLET | Freq: Every day | ORAL | Status: DC
  Administered 2011-10-05 – 2011-10-07 (×3): 50 mg via ORAL
  Filled 2011-10-05 (×3): qty 1

## 2011-10-05 MED ORDER — ASPIRIN EC 81 MG PO TBEC
81.0000 mg | DELAYED_RELEASE_TABLET | Freq: Every day | ORAL | Status: DC
  Administered 2011-10-05 – 2011-10-07 (×3): 81 mg via ORAL
  Filled 2011-10-05 (×3): qty 1

## 2011-10-05 MED ORDER — SIMVASTATIN 20 MG PO TABS
20.0000 mg | ORAL_TABLET | Freq: Every day | ORAL | Status: DC
  Administered 2011-10-06: 20 mg via ORAL
  Filled 2011-10-05: qty 1

## 2011-10-05 MED ORDER — SENNOSIDES-DOCUSATE SODIUM 8.6-50 MG PO TABS: 2.0000 | ORAL_TABLET | Freq: Every day | ORAL | Status: DC | PRN

## 2011-10-05 MED ORDER — ONDANSETRON HCL 4 MG/2ML IJ SOLN
4.0000 mg | Freq: Four times a day (QID) | INTRAMUSCULAR | Status: DC | PRN
  Administered 2011-10-05: 4 mg via INTRAVENOUS
  Filled 2011-10-05: qty 2

## 2011-10-05 MED ORDER — POTASSIUM CHLORIDE IN NACL 20-0.9 MEQ/L-% IV SOLN
INTRAVENOUS | Status: DC
  Administered 2011-10-05 – 2011-10-06 (×2): via INTRAVENOUS

## 2011-10-05 MED ORDER — POLYETHYLENE GLYCOL 3350 17 G PO PACK: 17.0000 g | PACK | Freq: Every day | ORAL | Status: DC | PRN

## 2011-10-05 MED ORDER — SODIUM CHLORIDE 0.9 % IV SOLN
Freq: Once | INTRAVENOUS | Status: AC
  Administered 2011-10-05: 03:00:00 via INTRAVENOUS

## 2011-10-05 MED ORDER — SODIUM CHLORIDE 0.9 % IV SOLN: INTRAVENOUS | Status: DC

## 2011-10-05 MED ORDER — ENOXAPARIN SODIUM 40 MG/0.4ML ~~LOC~~ SOLN
40.0000 mg | SUBCUTANEOUS | Status: DC
  Administered 2011-10-05 – 2011-10-06 (×2): 40 mg via SUBCUTANEOUS
  Filled 2011-10-05 (×2): qty 0.4

## 2011-10-05 MED ORDER — ACETAMINOPHEN 650 MG RE SUPP: 650.0000 mg | Freq: Four times a day (QID) | RECTAL | Status: DC | PRN

## 2011-10-05 MED ORDER — POTASSIUM CHLORIDE IN NACL 20-0.9 MEQ/L-% IV SOLN
INTRAVENOUS | Status: DC
  Filled 2011-10-05: qty 1000

## 2011-10-05 MED ORDER — SODIUM CHLORIDE 0.9 % IV BOLUS (SEPSIS): 500.0000 mL | Freq: Once | INTRAVENOUS | Status: DC

## 2011-10-05 MED ORDER — FLEET ENEMA 7-19 GM/118ML RE ENEM: 1.0000 | ENEMA | RECTAL | Status: DC | PRN

## 2011-10-05 MED ORDER — POTASSIUM CHLORIDE CRYS ER 20 MEQ PO TBCR
40.0000 meq | EXTENDED_RELEASE_TABLET | Freq: Two times a day (BID) | ORAL | Status: DC
  Administered 2011-10-05 – 2011-10-07 (×5): 40 meq via ORAL
  Filled 2011-10-05 (×2): qty 2
  Filled 2011-10-05: qty 1
  Filled 2011-10-05 (×2): qty 2
  Filled 2011-10-05: qty 1

## 2011-10-05 MED ORDER — ALPRAZOLAM 0.25 MG PO TABS
0.2500 mg | ORAL_TABLET | Freq: Two times a day (BID) | ORAL | Status: DC | PRN
  Administered 2011-10-05 – 2011-10-06 (×2): 0.25 mg via ORAL
  Filled 2011-10-05 (×2): qty 1

## 2011-10-05 MED ORDER — BISACODYL 10 MG RE SUPP: 10.0000 mg | RECTAL | Status: DC | PRN

## 2011-10-05 MED ORDER — TRAZODONE HCL 50 MG PO TABS: 25.0000 mg | ORAL_TABLET | Freq: Every evening | ORAL | Status: DC | PRN

## 2011-10-05 MED ORDER — ACETAMINOPHEN 325 MG PO TABS: 650.0000 mg | ORAL_TABLET | ORAL | Status: DC | PRN

## 2011-10-05 MED ORDER — SODIUM CHLORIDE 0.9 % IJ SOLN
INTRAMUSCULAR | Status: AC
  Filled 2011-10-05: qty 3

## 2011-10-05 MED ORDER — ONDANSETRON HCL 4 MG PO TABS: 4.0000 mg | ORAL_TABLET | ORAL | Status: DC | PRN

## 2011-10-05 NOTE — ED Notes (Signed)
Family reports that pt has been confused for 1 days, bp high

## 2011-10-05 NOTE — H&P (Signed)
PCP:  Dorothyann Peng M.D.  Orthopedic surgeon: Fuller Canada M.D.  Chief Complaint:  Acute confusion past 24 hours  HPI: [name] is an 77 y.o. African American female.   history of hypertension taking lisinopril/HCTZ combination, also chronic pain from osteoarthritis on narcotic pain medication, also taking a benzodiazepine, brought to the emergency room by her son who is her primary caregiver, complaining of acute confusion talking nonsense going to things that are not there. Reports that she usually does have some baseline dementia but she has suddenly gotten worse and unable to have a sensible. Some reports that she has not been eating and drinking as normal.  Although patient's conversation is largely inappropriate and unintelligible, he is able to deny fever cough or cold, she denies headache or neck stiffness. Denies nausea vomiting abdominal pain.  Baseline she ambulates with the assistance of both a walker and a cane, but without illness or ambulation is currently impaired   Rewiew of Systems:  The patient denies anorexia, fever, weight loss,, vision loss, decreased hearing, hoarseness, chest pain, syncope, dyspnea on exertion, peripheral edema, balance deficits, hemoptysis, abdominal pain, melena, hematochezia, severe indigestion/heartburn, hematuria, incontinence, genital sores, muscle weakness, suspicious skin lesions, transient blindness, unusual weight change, abnormal bleeding, enlarged lymph nodes, angioedema, and breast masses.   Past Medical History  Diagnosis Date  . Depression   . Diverticulosis     Colon   . Hyperlipidemia   . Hypertension   . Hx pulmonary embolism   . Polypharmacy   . Hepatic hemangioma   . Normocytic anemia   . Osteopenia 10/08/2003    Dx     Past Surgical History  Procedure Date  . Abdominal hysterectomy   . Vaginal  delivery     x10  . Fibroid tumor on shoulder   . Rt arm /shoulder surgery   . Rt closed reduction percutaneous  pins ext fixator 08/26/2007    Dr. Romeo Apple    Medications:  HOME MEDS: Prior to Admission medications   Medication Sig Start Date End Date Taking? Authorizing Provider  acetaminophen (TYLENOL) 500 MG tablet Take 500 mg by mouth every 6 (six) hours as needed.     Yes Historical Provider, MD  ALPRAZolam Prudy Feeler) 1 MG tablet Take 1 mg by mouth 3 (three) times daily.     Yes Historical Provider, MD  benazepril-hydrochlorthiazide (LOTENSIN HCT) 10-12.5 MG per tablet Take 1 tablet by mouth daily.     Yes Historical Provider, MD  HYDROcodone-acetaminophen (NORCO) 5-325 MG per tablet Take 1 tablet by mouth every 4 (four) hours as needed. 09/10/11  Yes Fuller Canada, MD  losartan-hydrochlorothiazide (HYZAAR) 50-12.5 MG per tablet 1 tablet daily.  05/08/11  Yes Historical Provider, MD  metoprolol (LOPRESSOR) 50 MG tablet Take 50 mg by mouth daily.     Yes Historical Provider, MD  promethazine (PHENERGAN) 25 MG tablet Take 25 mg by mouth every 6 (six) hours as needed.     Yes Historical Provider, MD  simvastatin (ZOCOR) 20 MG tablet Take 20 mg by mouth.     Yes Historical Provider, MD     Allergies:  Allergies  Allergen Reactions  . Erythromycin     REACTION: unknown reaction    Social History:   reports that she has never smoked. She does not have any smokeless tobacco history on file. She reports that she does not drink alcohol or use illicit drugs.  Family History: Family History  Problem Relation Age of Onset  . Diabetes  family history   . Cancer      family history   . Diabetes Father   . Cancer Sister     Breast  . Cancer Brother     Lung     Physical Exam: Filed Vitals:   10/05/11 0400 10/05/11 0423 10/05/11 0500 10/05/11 0600  BP: 147/77 147/77 164/79 160/78  Pulse:  96 101 108  Temp:      TempSrc:      Resp: 18 20 26 21   Height:      Weight:      SpO2:  99% 100% 100%   Blood pressure 160/78, pulse 108, temperature 98.7 F (37.1 C), temperature source  Oral, resp. rate 21, height 5\' 7"  (1.702 m), weight 63.504 kg (140 lb), SpO2 100.00%.  GEN: Pleasant but delirious elderly African American lady lying in the stretcher in no acute distress; cooperative with exam; she responds to all questions addressed to her family but all answers are inappropriate PSYCH:  alert and oriented x2 he knows she is at [facility]; does not appear anxious does not appear depressed; affect is normal HEENT: Mucous membranes pink and dry and anicteric; PERRLA; EOM intact; no cervical lymphadenopathy nor thyromegaly or carotid bruit; no JVD; no neck stiffness; no head trauma Breasts:: Not examined CHEST WALL: No tenderness CHEST: Normal respiration, occasional bibasilar crackles; HEART: Regular rate and rhythm; no murmurs rubs or gallops BACK: No kyphosis or scoliosis; no CVA tenderness ABDOMEN: Obese, soft non-tender; no masses, no organomegaly, normal abdominal bowel sounds; no pannus; no intertriginous candida. Rectal Exam: Not done EXTREMITIES:  arthropathy of the hands and knees; no edema; no ulcerations. Genitalia: not examined PULSES: 2+ and symmetric SKIN: Normal hydration no rash or ulceration CNS: Cranial nerves 2-12 grossly intact no focal neurologic deficit   Labs & Imaging Results for orders placed during the hospital encounter of 10/05/11 (from the past 48 hour(s))  CBC     Status: Abnormal   Collection Time   10/05/11  2:55 AM      Component Value Range Comment   WBC 5.6  4.0 - 10.5 (K/uL)    RBC 3.77 (*) 3.87 - 5.11 (MIL/uL)    Hemoglobin 11.4 (*) 12.0 - 15.0 (g/dL)    HCT 41.6 (*) 60.6 - 46.0 (%)    MCV 91.8  78.0 - 100.0 (fL)    MCH 30.2  26.0 - 34.0 (pg)    MCHC 32.9  30.0 - 36.0 (g/dL)    RDW 30.1  60.1 - 09.3 (%)    Platelets 266  150 - 400 (K/uL)   DIFFERENTIAL     Status: Abnormal   Collection Time   10/05/11  2:55 AM      Component Value Range Comment   Neutrophils Relative 52  43 - 77 (%)    Neutro Abs 2.9  1.7 - 7.7 (K/uL)     Lymphocytes Relative 33  12 - 46 (%)    Lymphs Abs 1.8  0.7 - 4.0 (K/uL)    Monocytes Relative 14 (*) 3 - 12 (%)    Monocytes Absolute 0.8  0.1 - 1.0 (K/uL)    Eosinophils Relative 1  0 - 5 (%)    Eosinophils Absolute 0.0  0.0 - 0.7 (K/uL)    Basophils Relative 1  0 - 1 (%)    Basophils Absolute 0.0  0.0 - 0.1 (K/uL)   COMPREHENSIVE METABOLIC PANEL     Status: Abnormal   Collection Time   10/05/11  2:55 AM      Component Value Range Comment   Sodium 137  135 - 145 (mEq/L)    Potassium 3.1 (*) 3.5 - 5.1 (mEq/L)    Chloride 101  96 - 112 (mEq/L)    CO2 22  19 - 32 (mEq/L)    Glucose, Bld 134 (*) 70 - 99 (mg/dL)    BUN 25 (*) 6 - 23 (mg/dL)    Creatinine, Ser 0.10 (*) 0.50 - 1.10 (mg/dL)    Calcium 27.2  8.4 - 10.5 (mg/dL)    Total Protein 7.5  6.0 - 8.3 (g/dL)    Albumin 4.2  3.5 - 5.2 (g/dL)    AST 18  0 - 37 (U/L)    ALT 8  0 - 35 (U/L)    Alkaline Phosphatase 64  39 - 117 (U/L)    Total Bilirubin 0.3  0.3 - 1.2 (mg/dL)    GFR calc non Af Amer 36 (*) >90 (mL/min)    GFR calc Af Amer 42 (*) >90 (mL/min)   URINALYSIS, ROUTINE W REFLEX MICROSCOPIC     Status: Abnormal   Collection Time   10/05/11  4:26 AM      Component Value Range Comment   Color, Urine YELLOW  YELLOW     Appearance CLEAR  CLEAR     Specific Gravity, Urine <1.005 (*) 1.005 - 1.030     pH 6.5  5.0 - 8.0     Glucose, UA NEGATIVE  NEGATIVE (mg/dL)    Hgb urine dipstick TRACE (*) NEGATIVE     Bilirubin Urine NEGATIVE  NEGATIVE     Ketones, ur NEGATIVE  NEGATIVE (mg/dL)    Protein, ur NEGATIVE  NEGATIVE (mg/dL)    Urobilinogen, UA 0.2  0.0 - 1.0 (mg/dL)    Nitrite NEGATIVE  NEGATIVE     Leukocytes, UA NEGATIVE  NEGATIVE    URINE MICROSCOPIC-ADD ON     Status: Normal   Collection Time   10/05/11  4:26 AM      Component Value Range Comment   Squamous Epithelial / LPF RARE  RARE     WBC, UA 0-2  <3 (WBC/hpf)    RBC / HPF 0-2  <3 (RBC/hpf)    Bacteria, UA RARE  RARE     Dg Chest Port 1 View  10/05/2011   *RADIOLOGY REPORT*  Clinical Data: Altered mental status.  PORTABLE CHEST - 1 VIEW  Comparison: 04/14/2005  Findings: Tortuous thoracic aorta.  Heart size upper normal limits. Mild linear retrocardiac opacity.  Otherwise, mild interstitial prominence without focal consolidation. No pleural effusion or pneumothorax.  No acute osseous abnormality.  IMPRESSION: Mild retrocardiac opacity likely reflects scarring or atelectasis. Otherwise, no focal consolidation.  Original Report Authenticated By: Waneta Martins, M.D.      Assessment Present on Admission:  .Gait abnormality .Delirium .Dehydration .Hypokalemia .Osteoarthritis .HYPERTENSION .Dementia   PLAN: We'll presume this lady is a acute delirium is related to her dehydration, and will admit her for hydration and monitoring.   Will withhold antibiotics at this time unless she develops a fever or other clear evidence of infection.   Will withhold her diuretics and ACE inhibitors, and monitor her blood pressure.   Will withhold a sedative medication such as a benzodiazepine sound narcotics, and monitor her pain level.   will get assistance from physical therapy and social work   Other plans as per orders.   Anisa Leanos 10/05/2011, 7:27 AM

## 2011-10-05 NOTE — Progress Notes (Signed)
CSW received referral due to dementia.  Spoke with Cranston Neighbor, RN, who reports pt lives with children and are requesting CM consult.  CSW to sign off.    Karn Cassis

## 2011-10-05 NOTE — ED Provider Notes (Signed)
History     CSN: [id number] Arrival date & time: 10/05/2011  2:09 AM   First MD Initiated Contact with Patient 10/05/11 0258      Chief Complaint  Patient presents with  . Altered Mental Status  . Hypertension    (Consider location/radiation/quality/duration/timing/severity/associated sxs/prior treatment) HPI Comments: Level 5 caveat due to confusion [name] is a 77 y.o. female who is brought to the Emergency Department by family with a complaint of increased confusion over  the last 48 hours. There has been no change in appetite, fever, chills, cough, vomitng, She has been observed waving to someone on the television and talking about the television program"[name]", saying she knows the secret to why people are drawn to the program. Son states that she has had a mild form of dementia but the change over the last 2 days is different.  Patient is a 77 y.o. female presenting with altered mental status and hypertension. The history is provided by a relative (son provided history).  Altered Mental Status This is a new problem. The current episode started 2 days ago. The problem has been gradually worsening. The symptoms are aggravated by nothing. The symptoms are relieved by nothing. She has tried nothing for the symptoms.  Hypertension    Past Medical History  Diagnosis Date  . Depression   . Diverticulosis     Colon   . Hyperlipidemia   . Hypertension   . Hx pulmonary embolism   . Polypharmacy   . Hepatic hemangioma   . Normocytic anemia   . Osteopenia 10/08/2003    Dx     Past Surgical History  Procedure Date  . Abdominal hysterectomy   . Vaginal  delivery     x10  . Fibroid tumor on shoulder   . Rt arm /shoulder surgery   . Rt closed reduction percutaneous pins ext fixator 08/26/2007    Dr. Romeo Apple    Family History  Problem Relation Age of Onset  . Diabetes      family history   . Cancer      family history     History  Substance  Use Topics  . Smoking status: Never Smoker   . Smokeless tobacco: Not on file  . Alcohol Use: No    OB History    Grav Para Term Preterm Abortions TAB SAB Ect Mult Living                  Review of Systems  Psychiatric/Behavioral: Positive for altered mental status.  All other systems reviewed and are negative.    Allergies  Erythromycin  Home Medications   Current Outpatient Rx  Name Route Sig Dispense Refill  . ALPRAZOLAM 1 MG PO TABS Oral Take 1 mg by mouth 3 (three) times daily.      . ASCORBIC ACID PO Oral Take by mouth.      . ASPIRIN 81 MG PO TBEC Oral Take 81 mg by mouth daily.      Marland Kitchen BENAZEPRIL-HYDROCHLOROTHIAZIDE 10-12.5 MG PO TABS Oral Take 1 tablet by mouth daily.      . CEPHALEXIN 500 MG PO CAPS Oral Take 500 mg by mouth.      . ESCITALOPRAM OXALATE 10 MG PO TABS Oral Take 10 mg by mouth daily.      Marland Kitchen HYDROCODONE-ACETAMINOPHEN 5-325 MG PO TABS Oral Take 1 tablet by mouth every 4 (four) hours as needed. 48 tablet 5  . LOSARTAN POTASSIUM-HCTZ 50-12.5 MG PO TABS      .  METOPROLOL TARTRATE 50 MG PO TABS Oral Take 50 mg by mouth daily.      Marland Kitchen SIMVASTATIN 20 MG PO TABS Oral Take 20 mg by mouth.      . TEMAZEPAM 7.5 MG PO CAPS Oral Take 7.5 mg by mouth every 6 (six) hours as needed.        BP 147/77  Pulse 96  Temp(Src) 98.7 F (37.1 C) (Oral)  Resp 20  Ht 5\' 7"  (1.702 m)  Wt 140 lb (63.504 kg)  BMI 21.93 kg/m2  SpO2 99%  Physical Exam  Nursing note and vitals reviewed. Constitutional: She appears well-developed and well-nourished. No distress.  HENT:  Head: Normocephalic.  Right Ear: External ear normal.  Left Ear: External ear normal.  Eyes: EOM are normal.  Neck: Neck supple. No JVD present. No thyromegaly present.  Cardiovascular: Normal rate and normal heart sounds.   Pulmonary/Chest: Effort normal and breath sounds normal.  Abdominal: Soft. Bowel sounds are normal. She exhibits no distension. There is no tenderness.  Musculoskeletal: Normal  range of motion.  Neurological: She has normal reflexes. No cranial nerve deficit. Coordination normal.       Alert, awake. Answers simple questions correctly. Unable to carry on a conversation. Oriented to person and place.  Skin: Skin is warm and dry.    ED Course  Procedures (including critical care time) Results for orders placed during the hospital encounter of 10/05/11  CBC      Component Value Range   WBC 5.6  4.0 - 10.5 (K/uL)   RBC 3.77 (*) 3.87 - 5.11 (MIL/uL)   Hemoglobin 11.4 (*) 12.0 - 15.0 (g/dL)   HCT 16.1 (*) 09.6 - 46.0 (%)   MCV 91.8  78.0 - 100.0 (fL)   MCH 30.2  26.0 - 34.0 (pg)   MCHC 32.9  30.0 - 36.0 (g/dL)   RDW 04.5  40.9 - 81.1 (%)   Platelets 266  150 - 400 (K/uL)  DIFFERENTIAL      Component Value Range   Neutrophils Relative 52  43 - 77 (%)   Neutro Abs 2.9  1.7 - 7.7 (K/uL)   Lymphocytes Relative 33  12 - 46 (%)   Lymphs Abs 1.8  0.7 - 4.0 (K/uL)   Monocytes Relative 14 (*) 3 - 12 (%)   Monocytes Absolute 0.8  0.1 - 1.0 (K/uL)   Eosinophils Relative 1  0 - 5 (%)   Eosinophils Absolute 0.0  0.0 - 0.7 (K/uL)   Basophils Relative 1  0 - 1 (%)   Basophils Absolute 0.0  0.0 - 0.1 (K/uL)  COMPREHENSIVE METABOLIC PANEL      Component Value Range   Sodium 137  135 - 145 (mEq/L)   Potassium 3.1 (*) 3.5 - 5.1 (mEq/L)   Chloride 101  96 - 112 (mEq/L)   CO2 22  19 - 32 (mEq/L)   Glucose, Bld 134 (*) 70 - 99 (mg/dL)   BUN 25 (*) 6 - 23 (mg/dL)   Creatinine, Ser 9.14 (*) 0.50 - 1.10 (mg/dL)   Calcium 78.2  8.4 - 10.5 (mg/dL)   Total Protein 7.5  6.0 - 8.3 (g/dL)   Albumin 4.2  3.5 - 5.2 (g/dL)   AST 18  0 - 37 (U/L)   ALT 8  0 - 35 (U/L)   Alkaline Phosphatase 64  39 - 117 (U/L)   Total Bilirubin 0.3  0.3 - 1.2 (mg/dL)   GFR calc non Af Amer 36 (*) >90 (mL/min)  GFR calc Af Amer 42 (*) >90 (mL/min)  URINALYSIS, ROUTINE W REFLEX MICROSCOPIC      Component Value Range   Color, Urine YELLOW  YELLOW    Appearance CLEAR  CLEAR    Specific Gravity,  Urine <1.005 (*) 1.005 - 1.030    pH 6.5  5.0 - 8.0    Glucose, UA NEGATIVE  NEGATIVE (mg/dL)   Hgb urine dipstick TRACE (*) NEGATIVE    Bilirubin Urine NEGATIVE  NEGATIVE    Ketones, ur NEGATIVE  NEGATIVE (mg/dL)   Protein, ur NEGATIVE  NEGATIVE (mg/dL)   Urobilinogen, UA 0.2  0.0 - 1.0 (mg/dL)   Nitrite NEGATIVE  NEGATIVE    Leukocytes, UA NEGATIVE  NEGATIVE   URINE MICROSCOPIC-ADD ON      Component Value Range   Squamous Epithelial / LPF RARE  RARE    WBC, UA 0-2  <3 (WBC/hpf)   RBC / HPF 0-2  <3 (RBC/hpf)   Bacteria, UA RARE  RARE     Dg Chest Port 1 View  10/05/2011  *RADIOLOGY REPORT*  Clinical Data: Altered mental status.  PORTABLE CHEST - 1 VIEW  Comparison: 04/14/2005  Findings: Tortuous thoracic aorta.  Heart size upper normal limits. Mild linear retrocardiac opacity.  Otherwise, mild interstitial prominence without focal consolidation. No pleural effusion or pneumothorax.  No acute osseous abnormality.  IMPRESSION: Mild retrocardiac opacity likely reflects scarring or atelectasis. Otherwise, no focal consolidation.  Original Report Authenticated By: Waneta Martins, M.D.     No diagnosis found.  CRITICAL CARE Performed by: Annamarie Dawley.  ?  Total critical care time:40 Critical care time was exclusive of separately billable procedures and treating other patients.  Critical care was necessary to treat or prevent imminent or life-threatening deterioration.  Critical care was time spent personally by me on the following activities: development of treatment plan with patient and/or surrogate as well as nursing, discussions with consultants, evaluation of patient's response to treatment, examination of patient, obtaining history from patient or surrogate, ordering and performing treatments and interventions, ordering and review of laboratory studies, ordering and review of radiographic studies, pulse oximetry and re-evaluation of patient's condition.  MDM  Patient  with baseline mild dementia now with 48 hours of increased confusion and anxiety. Labs unremarkable except for a mild hypokalemia and increase in BUN/Cr. Chest xray without acute findings. No clear source for recent confusion. Will arrange for admission for further work up and medication adjustments. 86 Spoke with Dr. Orvan Falconer, hospitalist who will see the patient in the ER. .The patient appears reasonably stabilized for admission considering the current resources, flow, and capabilities available in the ED at this time, and I doubt any other [facility] requiring further screening and/or treatment in the ED prior to admission. MDM Reviewed: previous chart, nursing note and vitals Interpretation: labs and x-ray           Nicoletta Dress. Colon Branch, MD 10/05/11 [id number]

## 2011-10-05 NOTE — ED Notes (Signed)
Family concerned about pt. Stating their mother seems to be more confused and like her mind is racing. I asked pt what was bothering her and she mentioned she was praying about not going blind. Pt still seems "nervous" about something. Family at bedside. Pt lying in bed with eyes closed. EDP notified and states she is going into see pt.

## 2011-10-05 NOTE — Plan of Care (Signed)
Problem: Consults Goal: General Medical Patient Education See Patient Education Module for specific education. Outcome: Completed/Met Date Met:  10/05/11 Falls prevention, hourly rounding, pt education channel  Problem: Phase I Progression Outcomes Goal: OOB as tolerated unless otherwise ordered Outcome: Progressing Up with assist, uses cane/walker at home. Goal: Initial discharge plan identified Outcome: Completed/Met Date Met:  10/05/11 Plan to go home with son. Goal: Other Phase I Outcomes/Goals Pt plan to go home with son and have home health.

## 2011-10-05 NOTE — ED Notes (Signed)
Pt given ginger ale.

## 2011-10-05 NOTE — ED Notes (Signed)
Bed assigned, per Sec, floor unable to take report at this time.

## 2011-10-06 ENCOUNTER — Encounter (HOSPITAL_COMMUNITY): Payer: Self-pay | Admitting: Internal Medicine

## 2011-10-06 ENCOUNTER — Inpatient Hospital Stay (HOSPITAL_COMMUNITY): Payer: Medicare Other

## 2011-10-06 DIAGNOSIS — F0390 Unspecified dementia without behavioral disturbance: Principal | ICD-10-CM

## 2011-10-06 DIAGNOSIS — D649 Anemia, unspecified: Secondary | ICD-10-CM | POA: Diagnosis not present

## 2011-10-06 DIAGNOSIS — R739 Hyperglycemia, unspecified: Secondary | ICD-10-CM

## 2011-10-06 DIAGNOSIS — F0392 Unspecified dementia, unspecified severity, with psychotic disturbance: Secondary | ICD-10-CM

## 2011-10-06 HISTORY — DX: Unspecified dementia, unspecified severity, without behavioral disturbance, psychotic disturbance, mood disturbance, and anxiety: F03.90

## 2011-10-06 HISTORY — DX: Hyperglycemia, unspecified: R73.9

## 2011-10-06 HISTORY — DX: Unspecified dementia, unspecified severity, with psychotic disturbance: F03.92

## 2011-10-06 LAB — CBC
MCHC: 32.4 g/dL (ref 30.0–36.0)
RDW: 12.6 % (ref 11.5–15.5)

## 2011-10-06 LAB — IRON AND TIBC
Saturation Ratios: 16 % — ABNORMAL LOW (ref 20–55)
TIBC: 293 ug/dL (ref 250–470)
UIBC: 245 ug/dL (ref 125–400)

## 2011-10-06 LAB — RPR: RPR Ser Ql: NONREACTIVE

## 2011-10-06 LAB — BASIC METABOLIC PANEL
BUN: 13 mg/dL (ref 6–23)
Calcium: 9.2 mg/dL (ref 8.4–10.5)
Creatinine, Ser: 0.97 mg/dL (ref 0.50–1.10)
GFR calc Af Amer: 61 mL/min — ABNORMAL LOW (ref >90)
GFR calc non Af Amer: 53 mL/min — ABNORMAL LOW (ref >90)
Potassium: 4.4 mEq/L (ref 3.5–5.1)

## 2011-10-06 LAB — CARDIAC PANEL(CRET KIN+CKTOT+MB+TROPI)
CK, MB: 9.4 ng/mL (ref 0.3–4.0)
CK, MB: 3.8 ng/mL (ref 0.3–4.0)
Total CK: 132 U/L (ref 7–177)
Total CK: 723 U/L — ABNORMAL HIGH (ref 7–177)
Troponin I: <0.3 ng/mL (ref <0.30)

## 2011-10-06 LAB — TSH: TSH: 2.784 u[IU]/mL (ref 0.350–4.500)

## 2011-10-06 MED ORDER — HALOPERIDOL LACTATE 5 MG/ML IJ SOLN
5.0000 mg | Freq: Once | INTRAMUSCULAR | Status: AC
  Administered 2011-10-06: 5 mg via INTRAVENOUS
  Filled 2011-10-06: qty 1

## 2011-10-06 MED ORDER — POTASSIUM CHLORIDE IN NACL 20-0.9 MEQ/L-% IV SOLN
INTRAVENOUS | Status: DC
  Administered 2011-10-06: 21:00:00 via INTRAVENOUS

## 2011-10-06 MED ORDER — MEMANTINE HCL 10 MG PO TABS
5.0000 mg | ORAL_TABLET | Freq: Every day | ORAL | Status: DC
  Administered 2011-10-06 – 2011-10-07 (×2): 5 mg via ORAL
  Filled 2011-10-06 (×2): qty 1

## 2011-10-06 MED ORDER — QUETIAPINE FUMARATE 25 MG PO TABS
12.5000 mg | ORAL_TABLET | Freq: Every day | ORAL | Status: DC
  Administered 2011-10-06: 12.5 mg via ORAL
  Filled 2011-10-06: qty 1

## 2011-10-06 NOTE — Progress Notes (Signed)
Subjective: The patient is currently sitting up in bed, talking on the telephone. Her son is in the room. She has no complaints of chest pain, shortness of breath, or headache. It is noted for confusion and agitation overnight. Haldol given.  Objective: Vital signs in last 24 hours: Filed Vitals:   10/05/11 1400 10/05/11 2009 10/05/11 2118 10/06/11 0537  BP: 147/82 145/83 147/80 132/82  Pulse: 98 107 90 71  Temp: 98.6 F (37 C) 98.6 F (37 C)  97.4 F (36.3 C)  TempSrc: Oral Oral  Oral  Resp: 20 18  18   Height:  5\' 7"  (1.702 m)    Weight:  63.7 kg (140 lb 6.9 oz)    SpO2: 97% 95% 95% 96%    Intake/Output Summary (Last 24 hours) at 10/06/11 1203 Last data filed at 10/06/11 1610  Gross per 24 hour  Intake   1020 ml  Output      0 ml  Net   1020 ml    Weight change: 2.464 kg (5 lb 6.9 oz)  Exam: General: Elderly after American woman who is currently sitting up in bed, in no acute distress. Lungs: Clear to auscultation bilaterally. Heart: S1, S2, with a soft systolic murmur. Abdomen: Mildly obese, positive bowel sounds, nontender, nondistended. Extremities: No pedal edema. Neurologic/psychological: Alert and oriented to her son herself, but not the hospital. Cranial nerves II through XII are grossly intact. Strength in the sitting position is globally 5 minus over 5 with no unilateral weakness. Her affect is pleasant. Her speech is clear, but she appears confused at times. She is not agitated.  Lab Results: Basic Metabolic Panel:  Basename 10/06/11 0028 10/05/11 1100 10/05/11 0255  NA 135 -- 137  K 4.4 -- 3.1*  CL 105 -- 101  CO2 23 -- 22  GLUCOSE 132* -- 134*  BUN 13 -- 25*  CREATININE 0.97 -- 1.33*  CALCIUM 9.2 -- 10.2  MG -- 2.0 --  PHOS -- -- --   Liver Function Tests:  Basename 10/05/11 0255  AST 18  ALT 8  ALKPHOS 64  BILITOT 0.3  PROT 7.5  ALBUMIN 4.2   No results found for this basename: LIPASE:2,AMYLASE:2 in the last 72 hours No results found for  this basename: AMMONIA:2 in the last 72 hours CBC:  Basename 10/06/11 0028 10/05/11 0255  WBC 5.6 5.6  NEUTROABS -- 2.9  HGB 10.6* 11.4*  HCT 32.7* 34.6*  MCV 92.6 91.8  PLT 254 266   Cardiac Enzymes:  Basename 10/06/11 0739 10/06/11 0028 10/05/11 1531  CKTOTAL 723* 132 147  CKMB 9.4* 3.1 3.7  CKMBINDEX -- -- --  TROPONINI <0.30 <0.30 <0.30   BNP: No results found for this basename: POCBNP:3 in the last 72 hours D-Dimer: No results found for this basename: DDIMER:2 in the last 72 hours CBG: No results found for this basename: GLUCAP:6 in the last 72 hours Hemoglobin A1C:  Basename 10/05/11 1100  HGBA1C 6.1*   Fasting Lipid Panel: No results found for this basename: CHOL,HDL,LDLCALC,TRIG,CHOLHDL,LDLDIRECT in the last 72 hours Thyroid Function Tests:  Basename 10/05/11 1531  TSH 2.784  T4TOTAL --  FREET4 --  T3FREE --  THYROIDAB --   Anemia Panel:  Basename 10/05/11 1531  VITAMINB12 394  FOLATE 14.3  FERRITIN 177  TIBC 293  IRON 48  RETICCTPCT --   Coagulation: No results found for this basename: LABPROT:2,INR:2 in the last 72 hours Urine Drug Screen:  Alcohol Level: No results found for this basename: ETH:2  in the last 72 hours    Micro: No results found for this or any previous visit (from the past 240 hour(s)).  Studies/Results: Ct Head Wo Contrast  10/06/2011  *RADIOLOGY REPORT*  Clinical Data: Confusion and hypertension.  History of dementia.  CT HEAD WITHOUT CONTRAST  Technique:  Contiguous axial images were obtained from the base of the skull through the vertex without contrast.  Comparison: 12/04/2010  Findings: Stable mild cortical atrophy which is age appropriate. No evidence of infarction, hemorrhage, mass effect, hydrocephalus or extra-axial fluid collection.  No evidence to suggest mass lesion.  The skull is within normal limits.  IMPRESSION: No acute abnormalities.  Original Report Authenticated By: Reola Calkins, M.D.   Dg Chest  Port 1 View  10/05/2011  *RADIOLOGY REPORT*  Clinical Data: Altered mental status.  PORTABLE CHEST - 1 VIEW  Comparison: 04/14/2005  Findings: Tortuous thoracic aorta.  Heart size upper normal limits. Mild linear retrocardiac opacity.  Otherwise, mild interstitial prominence without focal consolidation. No pleural effusion or pneumothorax.  No acute osseous abnormality.  IMPRESSION: Mild retrocardiac opacity likely reflects scarring or atelectasis. Otherwise, no focal consolidation.  Original Report Authenticated By: Waneta Martins, M.D.    Medications: I have reviewed the patient's current medications.  Assessment: Active Problems:  HYPERTENSION  Gait abnormality  Dehydration  Hypokalemia  Osteoarthritis  Dementia  ARF (acute renal failure)  Dementia, senile, with, delirium  Anemia  Hyperglycemia  1. Dementia with delirium. According to her son, her confusion has worsened over the past several weeks. Given that there is no obvious infection or severe metabolic abnormalities, it is likely that the patient's dementia has gotten worse. The CT scan of her head as ordered did not reveal any significant findings. She may benefit from [facility] and an antipsychotic at bedtime.  Hypertension. Stable and controlled.  Acute renal failure/mild dehydration. Resolved with IV fluids.  Anemia. This is mild. The results of the anemia panel are noted.  Hypokalemia. Repleted.  Hyperglycemia. Her hemoglobin A1c is 6.1. Her venous glucose will be monitored with the daily labs. Her son was informed that the patient may have prediabetes and appropriate adjustments in her diet at home will be warranted.  Plan:  Will add Namenda during the day and Seroquel each bedtime. We'll check an RPR. Continue supportive treatment. Decrease the rate of IV fluids.   LOS: 1 day   Lashena Signer 10/06/2011, 12:03 PM

## 2011-10-06 NOTE — Progress Notes (Signed)
Physical Therapy Evaluation Patient Details Name: [name] MRN: [id number] DOB: 07/05/1926 Today's Date: 10/06/2011  Problem List:  Patient Active Problem List  Diagnoses  . HEMANGIOMA, HEPATIC  . HYPERLIPIDEMIA  . ANEMIA, NORMOCYTIC  . DEPRESSION  . HYPERTENSION  . DIVERTICULOSIS, COLON  . KNEE, ARTHRITIS, DEGEN./OSTEO  . WRIST PAIN  . KNEE PAIN  . FIBROMYALGIA/FIBROMYOSITIS  . OSTEOPENIA  . COLLES' FRACTURE, RIGHT  . FRACTURE, RADIUS, DISTAL  . PULMONARY EMBOLISM, HX OF  . Knee pain  . OA (osteoarthritis) of knee  . Gait abnormality  . Dehydration  . Hypokalemia  . Osteoarthritis  . Dementia  . ARF (acute renal failure)  . Dementia, senile, with, delirium  . Anemia  . Hyperglycemia    Past Medical History:  Past Medical History  Diagnosis Date  . Depression   . Diverticulosis     Colon   . Hyperlipidemia   . Hypertension   . Hx pulmonary embolism   . Polypharmacy   . Hepatic hemangioma   . Normocytic anemia   . Osteopenia 10/08/2003    Dx   . Dementia, senile, with, delirium 10/06/2011  . Hyperglycemia 10/06/2011   Past Surgical History:  Past Surgical History  Procedure Date  . Abdominal hysterectomy   . Vaginal  delivery     x10  . Fibroid tumor on shoulder   . Rt arm /shoulder surgery   . Rt closed reduction percutaneous pins ext fixator 08/26/2007    Dr. Romeo Apple    PT Assessment/Plan/Recommendation PT Assessment Clinical Impression Statement: pt very close to functioanl baseline...recommend HHPT at d/c to confirm maximal safety PT Recommendation/Assessment: Patient will need skilled PT in the acute care venue PT Problem List: Decreased activity tolerance;Decreased mobility Barriers to Discharge: None PT Therapy Diagnosis : Difficulty walking;Generalized weakness PT Plan PT Frequency: Min 3X/week PT Treatment/Interventions: Gait training;Therapeutic exercise;Patient/family education PT Recommendation Follow Up Recommendations:  Home health PT Equipment Recommended: None recommended by PT PT Goals  Acute Rehab PT Goals PT Goal Formulation: With patient/family Pt will Ambulate: 16 - 50 feet;with modified independence;with rolling walker  PT Evaluation Precautions/Restrictions  Precautions Precautions: Fall Required Braces or Orthoses: No Restrictions Weight Bearing Restrictions: No Prior Functioning  Home Living Lives With: Family Receives Help From: Family Type of Home: House Home Layout: One level Home Access: Stairs to enter Entrance Stairs-Rails: Right Entrance Stairs-Number of Steps: 3 Home Adaptive Equipment: Wheelchair - manual;Walker - rolling;Bedside commode/3-in-1 Prior Function Level of Independence: Needs assistance with ADLs;Needs assistance with homemaking;Needs assistance with gait;Needs assistance with tranfers Driving: No Vocation: Retired Producer, television/film/video: Awake/alert Overall Cognitive Status: History of cognitive impairments History of Cognitive Impairment: Appears at baseline functioning Orientation Level: Oriented to person;Oriented to place;Oriented to situation Sensation/Coordination Sensation Light Touch: Appears Intact Proprioception: Appears Intact Coordination Gross Motor Movements are Fluid and Coordinated: Yes Fine Motor Movements are Fluid and Coordinated: Yes Extremity Assessment RUE Assessment RUE Assessment: Within Functional Limits LUE Assessment LUE Assessment: Within Functional Limits RLE Assessment RLE Assessment: Within Functional Limits LLE Assessment LLE Assessment: Within Functional Limits Mobility (including Balance) Bed Mobility Bed Mobility: Yes Supine to Sit: 5: Supervision Sitting - Scoot to Edge of Bed: 6: Modified independent (Device/Increase time) Transfers Transfers: Yes Sit to Stand: 6: Modified independent (Device/Increase time) Stand to Sit: 6: Modified independent (Device/Increase  time) Ambulation/Gait Ambulation/Gait: Yes Ambulation/Gait Assistance: 6: Modified independent (Device/Increase time) Ambulation Distance (Feet): 20 Feet Assistive device: Rolling walker Gait Pattern: Within Functional Limits Stairs: No Wheelchair Mobility Wheelchair Mobility: No  Posture/Postural Control Posture/Postural Control: No significant limitations Balance Balance Assessed: No Exercise    End of Session PT - End of Session Equipment Utilized During Treatment: Gait belt Activity Tolerance: Patient tolerated treatment well Patient left: in chair;with call bell in reach;with family/visitor present General Behavior During Session: [facility] for tasks performed Cognition: [facility] for tasks performed  Konrad Penta 10/06/2011, 12:02 PM

## 2011-10-06 NOTE — Progress Notes (Signed)
CRITICAL VALUE ALERT  Critical value received:  CK-MB 9.38  Date of notification:  10-06-11  Time of notification:  0934  Critical value read back: yes y Nurse who received alert:  j shelton rn    MD notified (in person) Dr Sherrie Mustache  Time of notification to MD: [phone] MD notified (2nd page):  Time of second page:  Responding MD:  Dr Sherrie Mustache Time MD responded:

## 2011-10-06 NOTE — Progress Notes (Signed)
Dr. Orvan Falconer notified by me that patient is agitated, anxious, and unable to rest.  Patient refused to lie down in bed and became very paranoid.  Order for haldol 5 mg received.

## 2011-10-06 NOTE — Progress Notes (Signed)
UR Chart Review Completed  

## 2011-10-07 ENCOUNTER — Encounter (HOSPITAL_COMMUNITY): Payer: Self-pay | Admitting: Internal Medicine

## 2011-10-07 MED ORDER — PNEUMOCOCCAL VAC POLYVALENT 25 MCG/0.5ML IJ INJ
0.5000 mL | INJECTION | INTRAMUSCULAR | Status: AC
  Administered 2011-10-07: 0.5 mL via INTRAMUSCULAR
  Filled 2011-10-07: qty 0.5

## 2011-10-07 MED ORDER — POTASSIUM CHLORIDE ER 10 MEQ PO TBCR: 10.0000 meq | EXTENDED_RELEASE_TABLET | Freq: Two times a day (BID) | ORAL | Status: DC

## 2011-10-07 MED ORDER — ALPRAZOLAM 1 MG PO TABS: ORAL_TABLET | ORAL | Status: DC

## 2011-10-07 MED ORDER — QUETIAPINE FUMARATE 25 MG PO TABS: ORAL_TABLET | ORAL | Status: DC

## 2011-10-07 MED ORDER — INFLUENZA VIRUS VACC SPLIT PF IM SUSP
0.5000 mL | INTRAMUSCULAR | Status: AC
  Administered 2011-10-07: 0.5 mL via INTRAMUSCULAR
  Filled 2011-10-07: qty 0.5

## 2011-10-07 MED ORDER — MEMANTINE HCL 5 MG PO TABS: 5.0000 mg | ORAL_TABLET | Freq: Every day | ORAL | Status: DC

## 2011-10-07 MED ORDER — MULTIVITAMINS PO TABS: 1.0000 | ORAL_TABLET | Freq: Every day | ORAL | Status: AC

## 2011-10-07 MED ORDER — HYDROCODONE-ACETAMINOPHEN 5-325 MG PO TABS: ORAL_TABLET | ORAL | Status: DC

## 2011-10-07 NOTE — Discharge Summary (Signed)
Physician Discharge Summary  [name] MRN: [id number] DOB/AGE: Jun 01, 1926 77 y.o.  PCP: Dorothyann Peng, MD   Admit date: 10/05/2011 Discharge date: 10/07/2011  Discharge Diagnoses:  1.Dementia with delirium/agitation. The etiology of her worsening confusion was thought to be secondary to advancing dementia, but with recent intra-articular knee steroid injections, steroid-induced delirium was a possibility. 2. Deconditioning and gait abnormality. 3. Hypokalemia. 4. Hyperglycemia, which may be secondary to recent intra-articular steroid injections.   5. Acute renal failure, secondary to volume depletion. Resolved. Her creatinine was 0.97 and her BUN was 13 at discharge. 6. Mild normocytic anemia. Her total iron was 48, TIBC 293, percent saturation 16, ferritin 177, folate 14.3, and vitamin B12 was 394. Her hemoglobin was 10.6 at the time of discharge. 7. Osteoarthritis of the knee. 8. Normal thyroid function with a TSH of 2.78.    Current Discharge Medication List    START taking these medications   Details  memantine (NAMENDA) 5 MG tablet Take 1 tablet (5 mg total) by mouth daily. Qty: 30 tablet, Refills: 1    multivitamin (THERAGRAN) per tablet Take 1 tablet by mouth daily.    potassium chloride (K-DUR) 10 MEQ tablet Take 1 tablet (10 mEq total) by mouth 2 (two) times daily. Qty: 60 tablet, Refills: 1    QUEtiapine (SEROQUEL) 25 MG tablet TAKE 1/2 TABLET AT BEDTIME. Qty: 15 tablet, Refills: 1      CONTINUE these medications which have CHANGED   Details  ALPRAZolam (XANAX) 1 MG tablet DECREASE THE DOSE TO 1/2 TABLET TWICE DAILY AS NEEDED Qty: 30 tablet, Refills: 0    HYDROcodone-acetaminophen (NORCO) 5-325 MG per tablet CHANGE THE DOSING TO 1 TABLET 2 TO 3 TIMES DAILY AS NEEDED. Qty: 30 tablet, Refills: 0   Associated Diagnoses: Knee pain      CONTINUE these medications which have NOT CHANGED   Details  acetaminophen (TYLENOL) 500 MG tablet Take 500 mg by  mouth every 6 (six) hours as needed.     benazepril-hydrochlorthiazide (LOTENSIN HCT) 10-12.5 MG per tablet Take 1 tablet by mouth daily.      metoprolol (LOPRESSOR) 50 MG tablet Take 50 mg by mouth daily.      promethazine (PHENERGAN) 25 MG tablet Take 25 mg by mouth every 6 (six) hours as needed.     simvastatin (ZOCOR) 20 MG tablet Take 20 mg by mouth.        STOP taking these medications     losartan-hydrochlorothiazide (HYZAAR) 50-12.5 MG per tablet         Discharge Condition: Improved and stable.  Disposition: Home.   Consults: None.   Significant Diagnostic Studies: Ct Head Wo Contrast  10/06/2011  *RADIOLOGY REPORT*  Clinical Data: Confusion and hypertension.  History of dementia.  CT HEAD WITHOUT CONTRAST  Technique:  Contiguous axial images were obtained from the base of the skull through the vertex without contrast.  Comparison: 12/04/2010  Findings: Stable mild cortical atrophy which is age appropriate. No evidence of infarction, hemorrhage, mass effect, hydrocephalus or extra-axial fluid collection.  No evidence to suggest mass lesion.  The skull is within normal limits.  IMPRESSION: No acute abnormalities.  Original Report Authenticated By: Reola Calkins, M.D.   Dg Chest Port 1 View  10/05/2011  *RADIOLOGY REPORT*  Clinical Data: Altered mental status.  PORTABLE CHEST - 1 VIEW  Comparison: 04/14/2005  Findings: Tortuous thoracic aorta.  Heart size upper normal limits. Mild linear retrocardiac opacity.  Otherwise, mild interstitial prominence without focal  consolidation. No pleural effusion or pneumothorax.  No acute osseous abnormality.  IMPRESSION: Mild retrocardiac opacity likely reflects scarring or atelectasis. Otherwise, no focal consolidation.  Original Report Authenticated By: Waneta Martins, M.D.    Microbiology: No results found for this or any previous visit (from the past 240 hour(s)).   Labs: Results for orders placed during the hospital  encounter of 10/05/11 (from the past 48 hour(s))  MAGNESIUM     Status: Normal   Collection Time   10/05/11 11:00 AM      Component Value Range Comment   Magnesium 2.0  1.5 - 2.5 (mg/dL)   HEMOGLOBIN Y7W     Status: Abnormal   Collection Time   10/05/11 11:00 AM      Component Value Range Comment   Hemoglobin A1C 6.1 (*) <5.7 (%)    Mean Plasma Glucose 128 (*) <117 (mg/dL)   CARDIAC PANEL(CRET KIN+CKTOT+MB+TROPI)     Status: Normal   Collection Time   10/05/11  3:31 PM      Component Value Range Comment   Total CK 147  7 - 177 (U/L)    CK, MB 3.7  0.3 - 4.0 (ng/mL)    Troponin I <0.30  <0.30 (ng/mL)    Relative Index 2.5  0.0 - 2.5    TSH     Status: Normal   Collection Time   10/05/11  3:31 PM      Component Value Range Comment   TSH 2.784  0.350 - 4.500 (uIU/mL)   VITAMIN B12     Status: Normal   Collection Time   10/05/11  3:31 PM      Component Value Range Comment   Vitamin B-12 394  211 - 911 (pg/mL)   FOLATE     Status: Normal   Collection Time   10/05/11  3:31 PM      Component Value Range Comment   Folate 14.3     IRON AND TIBC     Status: Abnormal   Collection Time   10/05/11  3:31 PM      Component Value Range Comment   Iron 48  42 - 135 (ug/dL)    TIBC 295  621 - 308 (ug/dL)    Saturation Ratios 16 (*) 20 - 55 (%)    UIBC 245  125 - 400 (ug/dL)   FERRITIN     Status: Normal   Collection Time   10/05/11  3:31 PM      Component Value Range Comment   Ferritin 177  10 - 291 (ng/mL)   CARDIAC PANEL(CRET KIN+CKTOT+MB+TROPI)     Status: Normal   Collection Time   10/06/11 12:28 AM      Component Value Range Comment   Total CK 132  7 - 177 (U/L)    CK, MB 3.1  0.3 - 4.0 (ng/mL)    Troponin I <0.30  <0.30 (ng/mL)    Relative Index 2.3  0.0 - 2.5    BASIC METABOLIC PANEL     Status: Abnormal   Collection Time   10/06/11 12:28 AM      Component Value Range Comment   Sodium 135  135 - 145 (mEq/L)    Potassium 4.4  3.5 - 5.1 (mEq/L) DELTA CHECK NOTED    Chloride 105  96 - 112 (mEq/L)    CO2 23  19 - 32 (mEq/L)    Glucose, Bld 132 (*) 70 - 99 (mg/dL)    BUN 13  6 -  23 (mg/dL) DELTA CHECK NOTED   Creatinine, Ser 0.97  0.50 - 1.10 (mg/dL)    Calcium 9.2  8.4 - 10.5 (mg/dL)    GFR calc non Af Amer 53 (*) >90 (mL/min)    GFR calc Af Amer 61 (*) >90 (mL/min)   CBC     Status: Abnormal   Collection Time   10/06/11 12:28 AM      Component Value Range Comment   WBC 5.6  4.0 - 10.5 (K/uL)    RBC 3.53 (*) 3.87 - 5.11 (MIL/uL)    Hemoglobin 10.6 (*) 12.0 - 15.0 (g/dL)    HCT 16.1 (*) 09.6 - 46.0 (%)    MCV 92.6  78.0 - 100.0 (fL)    MCH 30.0  26.0 - 34.0 (pg)    MCHC 32.4  30.0 - 36.0 (g/dL)    RDW 04.5  40.9 - 81.1 (%)    Platelets 254  150 - 400 (K/uL)   CARDIAC PANEL(CRET KIN+CKTOT+MB+TROPI)     Status: Abnormal   Collection Time   10/06/11  7:39 AM      Component Value Range Comment   Total CK 723 (*) 7 - 177 (U/L)    CK, MB 9.4 (*) 0.3 - 4.0 (ng/mL)    Troponin I <0.30  <0.30 (ng/mL)    Relative Index 1.3  0.0 - 2.5    RPR     Status: Normal   Collection Time   10/06/11  3:13 PM      Component Value Range Comment   RPR NON REACTIVE  NON REACTIVE    CARDIAC PANEL(CRET KIN+CKTOT+MB+TROPI)     Status: Abnormal   Collection Time   10/06/11  3:13 PM      Component Value Range Comment   Total CK 142  7 - 177 (U/L)    CK, MB 3.8  0.3 - 4.0 (ng/mL)    Troponin I <0.30  <0.30 (ng/mL)    Relative Index 2.7 (*) 0.0 - 2.5       HPI : The patient is an 77 year old woman with a past medical history significant for hypertension, osteoarthritis, anxiety, and dementia. The patient was brought to the emergency department by her son because of her worsening confusion. In the emergency department, she was noted to be afebrile and hemodynamically stable though she was mildly hypertensive. Her hemoglobin was 11.4. Her white blood cell count was within normal limits. Her serum sodium was 3.1. Her BUN was 25. Her creatinine was 1.33. Her urinalysis  was not consistent with a urinary tract infection. She was admitted for further evaluation and management.  HOSPITAL COURSE: The patient was started on IV fluid for hydration. Her ACE inhibitor/diuretic medication was temporarily withheld. The doses of alprazolam and hydrocodone were decreased. For further evaluation, a number of studies were ordered. The CT scan of her head revealed no acute intracranial findings. Her RPR was nonreactive. Her vitamin B 12 was within normal limits. Her TSH was within normal limits. Her cardiac enzymes were within normal limits. Her venous blood glucose was noted to be mildly elevated ranging in the 130s. She had no history of diabetes. Her hemoglobin A1c was borderline at 6.1. Her son was informed of this and was advised to discuss this finding with her primary care physician for further monitoring.  The patient did have an episode of sundowning during the hospitalization. She was treated appropriately with one dose of Haldol. She was subsequently started on a small dose of Seroquel at 12.5  mg each bedtime and Namenda during the day at 5 mg daily. These are the doses she was discharged to home on. Certainly, the dosing can be increased gradually over time, but this will be deferred to her primary care physician. Following the initial confusion and agitation, she remained calm and without agitation during the remainder of the hospitalization.  It is likely that the patient's worsening confusion was secondary to advancing dementia. However, in review of her history, it was noted that she had been receiving intra-articular knee steroid injections. This may not only explain her elevated venous glucose, but may also been a possible culprit for her recent increase in confusion. This was discussed with her son.  The patient's renal function normalized. She was restarted on her antihypertensive medications. She remained afebrile and hemodynamically stable. The physical therapist  evaluated her and recommended home health physical therapy and nursing; this was ordered. She was discharged to home with her son, who is her primary caretaker 24 hours daily.   Discharge Exam:  Blood pressure 118/76, pulse 63, temperature 99 F (37.2 C), temperature source Oral, resp. rate 20, height 5\' 7"  (1.702 m), weight 70.6 kg (155 lb 10.3 oz), SpO2 97.00%.  Lungs: Clear to auscultation bilaterally. Heart: S1, S2, with a soft systolic murmur. Abdomen: Mildly obese, positive bowel sounds, nontender, and nondistended. Extremities: No pedal edema. Neurologic: The patient is alert and oriented to herself her son and hospital. Her speech is clear. She has a pleasant affect. No agitation.   Discharge Orders    Future Orders Please Complete By Expires   Diet - low sodium heart healthy      Increase activity slowly         Follow-up Information    Follow up with SANDERS,ROBYN N on 10/20/2011. (AT 2:00 PM)    Contact information:   [address] [place] [address] [phone]           Discharge time: 40 minutes.  Signed: Sorrel Cassetta 10/07/2011, 10:49 AM

## 2011-10-07 NOTE — Progress Notes (Signed)
D/c instructions reviewed with son. Verbalized understanding. Pt dc'd to home with son.  Schonewitz, Candelaria Stagers 10/07/2011

## 2011-10-07 NOTE — Progress Notes (Signed)
Physical Therapy Treatment Patient Details Name: [name] MRN: [id number] DOB: Nov 19, 1925 Today's Date: 10/07/2011  TIME: 838-952/ GT  PT Assessment/Plan  PT - Assessment/Plan Comments on Treatment Session: Pt was MI for bed mobility, transfers and gait of 80' RW;pt being d/c to home today PT Goals  Acute Rehab PT Goals PT Goal: Ambulate - Progress: Met  PT Treatment Precautions/Restrictions  Precautions Precautions: Fall Required Braces or Orthoses: No Restrictions Weight Bearing Restrictions: No Mobility (including Balance) Ambulation/Gait Ambulation/Gait: Yes Ambulation/Gait Assistance: 6: Modified independent (Device/Increase time) Ambulation/Gait Assistance Details (indicate cue type and reason): RW Ambulation Distance (Feet): 65 Feet Assistive device: Rolling walker Gait velocity: slow    Exercise  General Exercises - Lower Extremity Ankle Circles/Pumps: Both;15 reps Quad Sets: 10 reps;Both Gluteal Sets: 10 reps Heel Slides: 10 reps;Both Hip ABduction/ADduction: Both;10 reps Straight Leg Raises: Both;10 reps End of Session PT - End of Session Activity Tolerance: Patient tolerated treatment well Patient left: in bed;with call bell in reach;with family/visitor present General Behavior During Session: [facility] for tasks performed Cognition: [facility] for tasks performed  March Steyer ATKINSO 10/07/2011, 8:59 AM

## 2011-10-07 NOTE — Progress Notes (Signed)
Pt d/c home today plans complete for home health followup on Friday, rn and pt. Discussed with son and patient.

## 2012-01-21 ENCOUNTER — Ambulatory Visit (INDEPENDENT_AMBULATORY_CARE_PROVIDER_SITE_OTHER): Payer: Medicare Other | Admitting: Orthopedic Surgery

## 2012-01-21 ENCOUNTER — Encounter: Payer: Self-pay | Admitting: Orthopedic Surgery

## 2012-01-21 DIAGNOSIS — M25569 Pain in unspecified knee: Secondary | ICD-10-CM

## 2012-01-21 DIAGNOSIS — F419 Anxiety disorder, unspecified: Secondary | ICD-10-CM

## 2012-01-21 MED ORDER — ALPRAZOLAM 1 MG PO TABS: ORAL_TABLET | ORAL | Status: DC

## 2012-01-21 MED ORDER — HYDROCODONE-ACETAMINOPHEN 5-325 MG PO TABS: ORAL_TABLET | ORAL | Status: DC

## 2012-01-21 NOTE — Patient Instructions (Signed)
You have received a steroid shot. 15% of patients experience increased pain at the injection site with in the next 24 hours. This is best treated with ice and tylenol extra strength 2 tabs every 8 hours. If you are still having pain please call the office.    

## 2012-01-21 NOTE — Progress Notes (Signed)
Patient ID: Rodman Key, female   DOB: [birth year], 78 y.o.   MRN: [id number] Chief Complaint  Patient presents with  . Knee Pain    requests knee injections   Knee  Injection Procedure Note  Pre-operative Diagnosis: left knee oa  Post-operative Diagnosis: same  Indications: pain  Anesthesia: ethyl chloride   Procedure Details   Verbal consent was obtained for the procedure. Time out was completed.The joint was prepped with alcohol, followed by  Ethyl chloride spray and A 20 gauge needle was inserted into the knee via lateral approach; 4ml 1% lidocaine and 1 ml of depomedrol  was then injected into the joint . The needle was removed and the area cleansed and dressed.  Complications:  None; patient tolerated the procedure well.  Knee  Injection Procedure Note  Pre-operative Diagnosis: right knee oa  Post-operative Diagnosis: same  Indications: pain  Anesthesia: ethyl chloride   Procedure Details   Verbal consent was obtained for the procedure. Time out was completed.The joint was prepped with alcohol, followed by  Ethyl chloride spray and A 20 gauge needle was inserted into the knee via lateral approach; 4ml 1% lidocaine and 1 ml of depomedrol  was then injected into the joint . The needle was removed and the area cleansed and dressed.  Complications:  None; patient tolerated the procedure well.

## 2012-01-24 ENCOUNTER — Telehealth: Payer: Self-pay | Admitting: Orthopedic Surgery

## 2012-01-24 NOTE — Telephone Encounter (Signed)
Message copied by Vickki Hearing on Sun Jan 24, 2012  7:44 AM ------      Message from: Cammie Sickle A      Created: Fri Jan 22, 2012 12:19 PM      Regarding: missing service charges       Dr Weyman Pedro [phone], DOS 01/21/12 has post op visit only, no injection charges.  Injection done per office note?  Please review,      Thanks,Carol

## 2012-02-29 ENCOUNTER — Other Ambulatory Visit: Payer: Self-pay | Admitting: *Deleted

## 2012-02-29 DIAGNOSIS — F419 Anxiety disorder, unspecified: Secondary | ICD-10-CM

## 2012-02-29 MED ORDER — ALPRAZOLAM 1 MG PO TABS: ORAL_TABLET | ORAL | Status: DC

## 2012-04-26 ENCOUNTER — Encounter: Payer: Self-pay | Admitting: Orthopedic Surgery

## 2012-04-26 ENCOUNTER — Ambulatory Visit (INDEPENDENT_AMBULATORY_CARE_PROVIDER_SITE_OTHER): Payer: Medicare Other | Admitting: Orthopedic Surgery

## 2012-04-26 VITALS — BP 116/70 | Ht 67.0 in | Wt 142.0 lb

## 2012-04-26 DIAGNOSIS — M1710 Unilateral primary osteoarthritis, unspecified knee: Secondary | ICD-10-CM

## 2012-04-26 NOTE — Patient Instructions (Signed)
You have received a steroid shot. 15% of patients experience increased pain at the injection site with in the next 24 hours. This is best treated with ice and tylenol extra strength 2 tabs every 8 hours. If you are still having pain please call the office.    

## 2012-04-26 NOTE — Progress Notes (Signed)
Patient ID: [name], female   DOB: 1926/09/06, 78 y.o.   MRN: [id number] Chief Complaint   Patient presents with   .  Knee Pain       requests knee injections    Knee  Injection Procedure Note  Pre-operative Diagnosis: left knee oa  Post-operative Diagnosis: same  Indications: pain  Anesthesia: ethyl chloride   Procedure Details   Verbal consent was obtained for the procedure. Time out was completed.The joint was prepped with alcohol, followed by  Ethyl chloride spray and A 20 gauge needle was inserted into the knee via lateral approach; 4ml 1% lidocaine and 1 ml of depomedrol  was then injected into the joint . The needle was removed and the area cleansed and dressed.  Complications:  None; patient tolerated the procedure well.  Knee  Injection Procedure Note  Pre-operative Diagnosis: right knee oa  Post-operative Diagnosis: same  Indications: pain  Anesthesia: ethyl chloride   Procedure Details   Verbal consent was obtained for the procedure. Time out was completed.The joint was prepped with alcohol, followed by  Ethyl chloride spray and A 20 gauge needle was inserted into the knee via lateral approach; 4ml 1% lidocaine and 1 ml of depomedrol  was then injected into the joint . The needle was removed and the area cleansed and dressed.  Complications:  None; patient tolerated the procedure well.

## 2012-06-24 ENCOUNTER — Other Ambulatory Visit: Payer: Self-pay | Admitting: *Deleted

## 2012-06-24 MED ORDER — PROMETHAZINE HCL 25 MG PO TABS: 25.0000 mg | ORAL_TABLET | Freq: Four times a day (QID) | ORAL | Status: DC | PRN

## 2012-07-25 ENCOUNTER — Other Ambulatory Visit: Payer: Self-pay | Admitting: *Deleted

## 2012-07-25 DIAGNOSIS — M25569 Pain in unspecified knee: Secondary | ICD-10-CM

## 2012-07-25 MED ORDER — HYDROCODONE-ACETAMINOPHEN 5-325 MG PO TABS: ORAL_TABLET | ORAL | Status: DC

## 2012-07-28 ENCOUNTER — Ambulatory Visit (INDEPENDENT_AMBULATORY_CARE_PROVIDER_SITE_OTHER): Payer: Medicare Other | Admitting: Orthopedic Surgery

## 2012-07-28 ENCOUNTER — Encounter: Payer: Self-pay | Admitting: Orthopedic Surgery

## 2012-07-28 VITALS — BP 90/50 | Ht 67.0 in | Wt 147.0 lb

## 2012-07-28 DIAGNOSIS — M25569 Pain in unspecified knee: Secondary | ICD-10-CM

## 2012-07-28 DIAGNOSIS — F419 Anxiety disorder, unspecified: Secondary | ICD-10-CM

## 2012-07-28 MED ORDER — ALPRAZOLAM 1 MG PO TABS: ORAL_TABLET | ORAL | Status: DC

## 2012-07-28 MED ORDER — HYDROCODONE-ACETAMINOPHEN 5-325 MG PO TABS: ORAL_TABLET | ORAL | Status: DC

## 2012-07-28 NOTE — Patient Instructions (Signed)
You have received a steroid shot. 15% of patients experience increased pain at the injection site with in the next 24 hours. This is best treated with ice and tylenol extra strength 2 tabs every 8 hours. If you are still having pain please call the office.    

## 2012-07-28 NOTE — Progress Notes (Signed)
Patient ID: Rodman Key, female   DOB: [birth year], 78 y.o.   MRN: [id number] Chief Complaint  Patient presents with  . Injections    repeat bilateral knee injections    Knee  Injection Procedure Note  Pre-operative Diagnosis: left knee oa  Post-operative Diagnosis: same  Indications: pain  Anesthesia: ethyl chloride   Procedure Details   Verbal consent was obtained for the procedure. Time out was completed.The joint was prepped with alcohol, followed by  Ethyl chloride spray and A 20 gauge needle was inserted into the knee via lateral approach; 4ml 1% lidocaine and 1 ml of depomedrol  was then injected into the joint . The needle was removed and the area cleansed and dressed.  Complications:  None; patient tolerated the procedure well.  Knee  Injection Procedure Note  Pre-operative Diagnosis: right knee oa  Post-operative Diagnosis: same  Indications: pain  Anesthesia: ethyl chloride   Procedure Details   Verbal consent was obtained for the procedure. Time out was completed.The joint was prepped with alcohol, followed by  Ethyl chloride spray and A 20 gauge needle was inserted into the knee via lateral approach; 4ml 1% lidocaine and 1 ml of depomedrol  was then injected into the joint . The needle was removed and the area cleansed and dressed.  Complications:  None; patient tolerated the procedure well.

## 2012-08-26 ENCOUNTER — Other Ambulatory Visit: Payer: Self-pay | Admitting: *Deleted

## 2012-08-26 DIAGNOSIS — F419 Anxiety disorder, unspecified: Secondary | ICD-10-CM

## 2012-08-26 MED ORDER — ALPRAZOLAM 1 MG PO TABS: 0.5000 mg | ORAL_TABLET | Freq: Two times a day (BID) | ORAL | Status: DC | PRN

## 2012-10-27 ENCOUNTER — Ambulatory Visit (INDEPENDENT_AMBULATORY_CARE_PROVIDER_SITE_OTHER): Payer: Medicare Other | Admitting: Orthopedic Surgery

## 2012-10-27 VITALS — BP 120/74 | Ht 67.0 in | Wt 147.0 lb

## 2012-10-27 DIAGNOSIS — M1710 Unilateral primary osteoarthritis, unspecified knee: Secondary | ICD-10-CM

## 2012-10-27 DIAGNOSIS — IMO0002 Reserved for concepts with insufficient information to code with codable children: Secondary | ICD-10-CM

## 2012-10-27 NOTE — Patient Instructions (Signed)
You have received a steroid shot. 15% of patients experience increased pain at the injection site with in the next 24 hours. This is best treated with ice and tylenol extra strength 2 tabs every 8 hours. If you are still having pain please call the office.    

## 2012-10-27 NOTE — Progress Notes (Signed)
Patient ID: [name], female   DOB: 1926/01/13, 78 y.o.   MRN: [id number] Chief Complaint  Patient presents with  . Follow-up    3 month injections bilateral knees    Knee  Injection Procedure Note  Pre-operative Diagnosis: left knee oa  Post-operative Diagnosis: same  Indications: pain  Anesthesia: ethyl chloride   Procedure Details   Verbal consent was obtained for the procedure. Time out was completed.The joint was prepped with alcohol, followed by  Ethyl chloride spray and A 20 gauge needle was inserted into the knee via lateral approach; 4ml 1% lidocaine and 1 ml of depomedrol  was then injected into the joint . The needle was removed and the area cleansed and dressed.  Complications:  None; patient tolerated the procedure well.  Knee  Injection Procedure Note  Pre-operative Diagnosis: right knee oa  Post-operative Diagnosis: same  Indications: pain  Anesthesia: ethyl chloride   Procedure Details   Verbal consent was obtained for the procedure. Time out was completed.The joint was prepped with alcohol, followed by  Ethyl chloride spray and A 20 gauge needle was inserted into the knee via lateral approach; 4ml 1% lidocaine and 1 ml of depomedrol  was then injected into the joint . The needle was removed and the area cleansed and dressed.  Complications:  None; patient tolerated the procedure well.

## 2013-01-25 ENCOUNTER — Ambulatory Visit (INDEPENDENT_AMBULATORY_CARE_PROVIDER_SITE_OTHER): Payer: Medicare Other | Admitting: Orthopedic Surgery

## 2013-01-25 VITALS — BP 102/56 | Ht 67.0 in | Wt 147.0 lb

## 2013-01-25 DIAGNOSIS — F419 Anxiety disorder, unspecified: Secondary | ICD-10-CM

## 2013-01-25 DIAGNOSIS — M1710 Unilateral primary osteoarthritis, unspecified knee: Secondary | ICD-10-CM

## 2013-01-25 DIAGNOSIS — M25569 Pain in unspecified knee: Secondary | ICD-10-CM

## 2013-01-25 MED ORDER — ALPRAZOLAM 1 MG PO TABS: 0.5000 mg | ORAL_TABLET | Freq: Two times a day (BID) | ORAL | Status: DC | PRN

## 2013-01-25 MED ORDER — HYDROCODONE-ACETAMINOPHEN 5-325 MG PO TABS: ORAL_TABLET | ORAL | Status: DC

## 2013-01-25 NOTE — Progress Notes (Signed)
Patient ID: [name], female   DOB: Sep 03, 1926, 79 y.o.   MRN: [id number] Chief Complaint  Patient presents with  . Knee Pain    3 month injection, biilateral knees   Knee  Injection Procedure Note  Pre-operative Diagnosis: right knee oa  Post-operative Diagnosis: same  Indications: pain  Anesthesia: ethyl chloride   Procedure Details   Verbal consent was obtained for the procedure. Time out was completed.The joint was prepped with alcohol, followed by  Ethyl chloride spray and A 20 gauge needle was inserted into the knee via lateral approach; 4ml 1% lidocaine and 1 ml of depomedrol  was then injected into the joint . The needle was removed and the area cleansed and dressed.  Complications:  None; patient tolerated the procedure well.  Knee  Injection Procedure Note  Pre-operative Diagnosis: left knee oa  Post-operative Diagnosis: same  Indications: pain  Anesthesia: ethyl chloride   Procedure Details   Verbal consent was obtained for the procedure. Time out was completed.The joint was prepped with alcohol, followed by  Ethyl chloride spray and A 20 gauge needle was inserted into the knee via lateral approach; 4ml 1% lidocaine and 1 ml of depomedrol  was then injected into the joint . The needle was removed and the area cleansed and dressed.  Complications:  None; patient tolerated the procedure well.

## 2013-04-27 ENCOUNTER — Ambulatory Visit: Payer: Medicare Other | Admitting: Orthopedic Surgery

## 2013-05-09 ENCOUNTER — Ambulatory Visit: Payer: Medicare Other | Admitting: Orthopedic Surgery

## 2013-05-30 ENCOUNTER — Ambulatory Visit (INDEPENDENT_AMBULATORY_CARE_PROVIDER_SITE_OTHER): Payer: Medicare Other | Admitting: Orthopedic Surgery

## 2013-05-30 VITALS — BP 164/96 | Ht 67.0 in | Wt 147.0 lb

## 2013-05-30 DIAGNOSIS — F419 Anxiety disorder, unspecified: Secondary | ICD-10-CM

## 2013-05-30 DIAGNOSIS — M25569 Pain in unspecified knee: Secondary | ICD-10-CM

## 2013-05-30 DIAGNOSIS — R110 Nausea: Secondary | ICD-10-CM

## 2013-05-30 DIAGNOSIS — M1710 Unilateral primary osteoarthritis, unspecified knee: Secondary | ICD-10-CM

## 2013-05-30 MED ORDER — PROMETHAZINE HCL 25 MG PO TABS: 25.0000 mg | ORAL_TABLET | Freq: Four times a day (QID) | ORAL | Status: DC | PRN

## 2013-05-30 MED ORDER — ALPRAZOLAM 1 MG PO TABS: 0.5000 mg | ORAL_TABLET | Freq: Two times a day (BID) | ORAL | Status: DC | PRN

## 2013-05-30 NOTE — Progress Notes (Signed)
Patient ID: [name], female   DOB: August 24, 1926, 79 y.o.   MRN: [id number] Chief Complaint  Patient presents with  . Follow-up    3 month injection, biilateral knees  c/o nausea  Not related to eating Sporadic No abdominal pain    Knee  Injection Procedure Note  Pre-operative Diagnosis: left knee oa  Post-operative Diagnosis: same  Indications: pain  Anesthesia: ethyl chloride   Procedure Details   Verbal consent was obtained for the procedure. Time out was completed.The joint was prepped with alcohol, followed by  Ethyl chloride spray and A 20 gauge needle was inserted into the knee via lateral approach; 4ml 1% lidocaine and 1 ml of depomedrol  was then injected into the joint . The needle was removed and the area cleansed and dressed.  Complications:  None; patient tolerated the procedure well. Knee  Injection Procedure Note  Pre-operative Diagnosis: right knee oa  Post-operative Diagnosis: same  Indications: pain  Anesthesia: ethyl chloride   Procedure Details   Verbal consent was obtained for the procedure. Time out was completed.The joint was prepped with alcohol, followed by  Ethyl chloride spray and A 20 gauge needle was inserted into the knee via lateral approach; 4ml 1% lidocaine and 1 ml of depomedrol  was then injected into the joint . The needle was removed and the area cleansed and dressed.  Complications:  None; patient tolerated the procedure well.  Encounter Diagnoses  Name Primary?  . OA (osteoarthritis) of knee   . Knee pain, unspecified laterality   . Anxiety   . Nausea alone Yes

## 2013-05-30 NOTE — Patient Instructions (Signed)
You have received a steroid shot. 15% of patients experience increased pain at the injection site with in the next 24 hours. This is best treated with ice and tylenol extra strength 2 tabs every 8 hours. If you are still having pain please call the office.    

## 2013-09-05 ENCOUNTER — Encounter: Payer: Self-pay | Admitting: Orthopedic Surgery

## 2013-09-05 ENCOUNTER — Telehealth: Payer: Self-pay | Admitting: Orthopedic Surgery

## 2013-09-05 ENCOUNTER — Other Ambulatory Visit: Payer: Self-pay | Admitting: Orthopedic Surgery

## 2013-09-05 ENCOUNTER — Ambulatory Visit (INDEPENDENT_AMBULATORY_CARE_PROVIDER_SITE_OTHER): Payer: Medicare Other | Admitting: Orthopedic Surgery

## 2013-09-05 VITALS — BP 132/60 | Ht 67.0 in | Wt 147.0 lb

## 2013-09-05 DIAGNOSIS — M1710 Unilateral primary osteoarthritis, unspecified knee: Secondary | ICD-10-CM

## 2013-09-05 DIAGNOSIS — M25569 Pain in unspecified knee: Secondary | ICD-10-CM

## 2013-09-05 MED ORDER — HYDROCODONE-ACETAMINOPHEN 5-325 MG PO TABS: ORAL_TABLET | ORAL | Status: DC

## 2013-09-05 NOTE — Telephone Encounter (Signed)
[name] called back for [name] and said she does need the Hydrocodone prescription.  Will call [phone] when ready

## 2013-09-05 NOTE — Patient Instructions (Signed)
You have received a steroid shot. 15% of patients experience increased pain at the injection site with in the next 24 hours. This is best treated with ice and tylenol extra strength 2 tabs every 8 hours. If you are still having pain please call the office.    

## 2013-09-05 NOTE — Progress Notes (Signed)
Patient ID: [name], female   DOB: 06/13/1926, 79 y.o.   MRN: [id number] Chief Complaint   Patient presents with   .  Follow-up       3 month recheck repeat bilateral injections     Knee  Injection Procedure Note  Pre-operative Diagnosis: left knee oa  Post-operative Diagnosis: same  Indications: pain  Anesthesia: ethyl chloride   Procedure Details   Verbal consent was obtained for the procedure. Time out was completed.The joint was prepped with alcohol, followed by  Ethyl chloride spray and A 20 gauge needle was inserted into the knee via lateral approach; 4ml 1% lidocaine and 1 ml of depomedrol  was then injected into the joint . The needle was removed and the area cleansed and dressed.  Complications:  None; patient tolerated the procedure well. Knee  Injection Procedure Note  Pre-operative Diagnosis: right knee oa  Post-operative Diagnosis: same  Indications: pain  Anesthesia: ethyl chloride   Procedure Details   Verbal consent was obtained for the procedure. Time out was completed.The joint was prepped with alcohol, followed by  Ethyl chloride spray and A 20 gauge needle was inserted into the knee via lateral approach; 4ml 1% lidocaine and 1 ml of depomedrol  was then injected into the joint . The needle was removed and the area cleansed and dressed.  Complications:  None; patient tolerated the procedure well.     Encounter Diagnosis   Name  Primary?   .  OA (osteoarthritis) of knee  Yes     

## 2013-09-07 NOTE — Telephone Encounter (Signed)
Patient picked up prescription.

## 2013-10-06 ENCOUNTER — Other Ambulatory Visit: Payer: Self-pay | Admitting: Orthopedic Surgery

## 2013-10-06 ENCOUNTER — Telehealth: Payer: Self-pay | Admitting: Radiology

## 2013-10-06 DIAGNOSIS — M1710 Unilateral primary osteoarthritis, unspecified knee: Secondary | ICD-10-CM

## 2013-10-06 MED ORDER — HYDROCODONE-ACETAMINOPHEN 5-325 MG PO TABS: 1.0000 | ORAL_TABLET | Freq: Three times a day (TID) | ORAL | Status: DC | PRN

## 2013-10-11 ENCOUNTER — Other Ambulatory Visit: Payer: Self-pay | Admitting: *Deleted

## 2013-10-11 DIAGNOSIS — M1990 Unspecified osteoarthritis, unspecified site: Secondary | ICD-10-CM

## 2013-10-11 MED ORDER — HYDROCODONE-ACETAMINOPHEN 5-325 MG PO TABS: 1.0000 | ORAL_TABLET | Freq: Three times a day (TID) | ORAL | Status: DC | PRN

## 2013-11-21 NOTE — Telephone Encounter (Signed)
Medication was fillled, patient picked it up.

## 2013-12-07 ENCOUNTER — Ambulatory Visit (INDEPENDENT_AMBULATORY_CARE_PROVIDER_SITE_OTHER): Payer: Medicare Other | Admitting: Orthopedic Surgery

## 2013-12-07 VITALS — BP 154/75 | Ht 67.0 in | Wt 147.0 lb

## 2013-12-07 DIAGNOSIS — M179 Osteoarthritis of knee, unspecified: Secondary | ICD-10-CM

## 2013-12-07 DIAGNOSIS — IMO0002 Reserved for concepts with insufficient information to code with codable children: Secondary | ICD-10-CM

## 2013-12-07 DIAGNOSIS — M129 Arthropathy, unspecified: Secondary | ICD-10-CM | POA: Diagnosis not present

## 2013-12-07 DIAGNOSIS — M1710 Unilateral primary osteoarthritis, unspecified knee: Secondary | ICD-10-CM

## 2013-12-07 DIAGNOSIS — M1990 Unspecified osteoarthritis, unspecified site: Secondary | ICD-10-CM

## 2013-12-07 NOTE — Patient Instructions (Signed)

## 2013-12-07 NOTE — Progress Notes (Signed)
Patient ID: [name], female   DOB: 1926/07/25, 80 y.o.   MRN: [id number] Chief Complaint   Patient presents with   .  Follow-up       3 month recheck repeat bilateral injections     Knee  Injection Procedure Note  Pre-operative Diagnosis: left knee oa  Post-operative Diagnosis: same  Indications: pain  Anesthesia: ethyl chloride   Procedure Details   Verbal consent was obtained for the procedure. Time out was completed.The joint was prepped with alcohol, followed by  Ethyl chloride spray and A 20 gauge needle was inserted into the knee via lateral approach; 19ml 1% lidocaine and 1 ml of depomedrol  was then injected into the joint . The needle was removed and the area cleansed and dressed.  Complications:  None; patient tolerated the procedure well. Knee  Injection Procedure Note  Pre-operative Diagnosis: right knee oa  Post-operative Diagnosis: same  Indications: pain  Anesthesia: ethyl chloride   Procedure Details   Verbal consent was obtained for the procedure. Time out was completed.The joint was prepped with alcohol, followed by  Ethyl chloride spray and A 20 gauge needle was inserted into the knee via lateral approach; 37ml 1% lidocaine and 1 ml of depomedrol  was then injected into the joint . The needle was removed and the area cleansed and dressed.  Complications:  None; patient tolerated the procedure well.     Encounter Diagnosis   Name  Primary?   .  OA (osteoarthritis) of knee  Yes

## 2013-12-13 DIAGNOSIS — I10 Essential (primary) hypertension: Secondary | ICD-10-CM | POA: Diagnosis not present

## 2013-12-13 DIAGNOSIS — F0390 Unspecified dementia without behavioral disturbance: Secondary | ICD-10-CM | POA: Diagnosis not present

## 2014-01-10 DIAGNOSIS — I110 Hypertensive heart disease with heart failure: Secondary | ICD-10-CM | POA: Diagnosis not present

## 2014-01-10 DIAGNOSIS — I509 Heart failure, unspecified: Secondary | ICD-10-CM | POA: Diagnosis not present

## 2014-01-10 DIAGNOSIS — Z9181 History of falling: Secondary | ICD-10-CM | POA: Diagnosis not present

## 2014-01-10 DIAGNOSIS — F0390 Unspecified dementia without behavioral disturbance: Secondary | ICD-10-CM | POA: Diagnosis not present

## 2014-01-10 DIAGNOSIS — D649 Anemia, unspecified: Secondary | ICD-10-CM | POA: Diagnosis not present

## 2014-02-05 DIAGNOSIS — Z9181 History of falling: Secondary | ICD-10-CM | POA: Diagnosis not present

## 2014-02-05 DIAGNOSIS — M1990 Unspecified osteoarthritis, unspecified site: Secondary | ICD-10-CM | POA: Diagnosis not present

## 2014-02-05 DIAGNOSIS — I10 Essential (primary) hypertension: Secondary | ICD-10-CM | POA: Diagnosis not present

## 2014-02-06 DIAGNOSIS — Z9181 History of falling: Secondary | ICD-10-CM | POA: Diagnosis not present

## 2014-02-06 DIAGNOSIS — M1990 Unspecified osteoarthritis, unspecified site: Secondary | ICD-10-CM | POA: Diagnosis not present

## 2014-02-06 DIAGNOSIS — I10 Essential (primary) hypertension: Secondary | ICD-10-CM | POA: Diagnosis not present

## 2014-03-08 ENCOUNTER — Ambulatory Visit (INDEPENDENT_AMBULATORY_CARE_PROVIDER_SITE_OTHER): Payer: Medicare Other | Admitting: Orthopedic Surgery

## 2014-03-08 VITALS — BP 133/76 | Ht 67.0 in | Wt 147.0 lb

## 2014-03-08 DIAGNOSIS — IMO0002 Reserved for concepts with insufficient information to code with codable children: Secondary | ICD-10-CM | POA: Diagnosis not present

## 2014-03-08 DIAGNOSIS — M129 Arthropathy, unspecified: Secondary | ICD-10-CM | POA: Diagnosis not present

## 2014-03-08 DIAGNOSIS — F419 Anxiety disorder, unspecified: Secondary | ICD-10-CM

## 2014-03-08 DIAGNOSIS — M1710 Unilateral primary osteoarthritis, unspecified knee: Secondary | ICD-10-CM

## 2014-03-08 DIAGNOSIS — M1990 Unspecified osteoarthritis, unspecified site: Secondary | ICD-10-CM

## 2014-03-08 MED ORDER — ALPRAZOLAM 0.5 MG PO TABS: 0.5000 mg | ORAL_TABLET | Freq: Every evening | ORAL | Status: DC | PRN

## 2014-03-08 MED ORDER — ALPRAZOLAM 1 MG PO TABS: 0.5000 mg | ORAL_TABLET | Freq: Every day | ORAL | Status: DC

## 2014-03-08 MED ORDER — HYDROCODONE-ACETAMINOPHEN 5-325 MG PO TABS: 1.0000 | ORAL_TABLET | Freq: Three times a day (TID) | ORAL | Status: DC | PRN

## 2014-03-08 NOTE — Addendum Note (Signed)
Addended by: Arther Abbott E on: 03/08/2014 12:20 PM   Modules accepted: Orders

## 2014-03-08 NOTE — Progress Notes (Signed)
Patient ID: [name], female   DOB: 1926-10-15, 80 y.o.   MRN: [id number]   Follow-up           3 month recheck repeat bilateral injections      Knee  Injection Procedure Note  Pre-operative Diagnosis: left knee oa  Post-operative Diagnosis: same  Indications: pain  Anesthesia: ethyl chloride   Procedure Details   Verbal consent was obtained for the procedure. Time out was completed.The joint was prepped with alcohol, followed by  Ethyl chloride spray and A 20 gauge needle was inserted into the knee via lateral approach; 36ml 1% lidocaine and 1 ml of depomedrol  was then injected into the joint . The needle was removed and the area cleansed and dressed.  Complications:  None; patient tolerated the procedure well. Knee  Injection Procedure Note  Pre-operative Diagnosis: right knee oa  Post-operative Diagnosis: same  Indications: pain  Anesthesia: ethyl chloride   Procedure Details   Verbal consent was obtained for the procedure. Time out was completed.The joint was prepped with alcohol, followed by  Ethyl chloride spray and A 20 gauge needle was inserted into the knee via lateral approach; 36ml 1% lidocaine and 1 ml of depomedrol  was then injected into the joint . The needle was removed and the area cleansed and dressed.  Complications:  None; patient tolerated the procedure well.

## 2014-03-08 NOTE — Patient Instructions (Signed)

## 2014-03-14 DIAGNOSIS — M1710 Unilateral primary osteoarthritis, unspecified knee: Secondary | ICD-10-CM | POA: Diagnosis not present

## 2014-03-14 DIAGNOSIS — G4700 Insomnia, unspecified: Secondary | ICD-10-CM | POA: Diagnosis not present

## 2014-03-14 DIAGNOSIS — I110 Hypertensive heart disease with heart failure: Secondary | ICD-10-CM | POA: Diagnosis not present

## 2014-03-14 DIAGNOSIS — IMO0002 Reserved for concepts with insufficient information to code with codable children: Secondary | ICD-10-CM | POA: Diagnosis not present

## 2014-03-14 DIAGNOSIS — I10 Essential (primary) hypertension: Secondary | ICD-10-CM | POA: Diagnosis not present

## 2014-04-10 DIAGNOSIS — I10 Essential (primary) hypertension: Secondary | ICD-10-CM | POA: Diagnosis not present

## 2014-04-10 DIAGNOSIS — G4700 Insomnia, unspecified: Secondary | ICD-10-CM | POA: Diagnosis not present

## 2014-04-10 DIAGNOSIS — R609 Edema, unspecified: Secondary | ICD-10-CM | POA: Diagnosis not present

## 2014-04-10 DIAGNOSIS — K5900 Constipation, unspecified: Secondary | ICD-10-CM | POA: Diagnosis not present

## 2014-06-07 ENCOUNTER — Ambulatory Visit (INDEPENDENT_AMBULATORY_CARE_PROVIDER_SITE_OTHER): Payer: Medicare Other | Admitting: Orthopedic Surgery

## 2014-06-07 VITALS — BP 122/68 | Ht 67.0 in | Wt 147.0 lb

## 2014-06-07 DIAGNOSIS — M1990 Unspecified osteoarthritis, unspecified site: Secondary | ICD-10-CM

## 2014-06-07 DIAGNOSIS — M129 Arthropathy, unspecified: Secondary | ICD-10-CM

## 2014-06-07 DIAGNOSIS — M1710 Unilateral primary osteoarthritis, unspecified knee: Secondary | ICD-10-CM | POA: Diagnosis not present

## 2014-06-07 DIAGNOSIS — M170 Bilateral primary osteoarthritis of knee: Secondary | ICD-10-CM

## 2014-06-07 MED ORDER — HYDROCODONE-ACETAMINOPHEN 5-325 MG PO TABS: 1.0000 | ORAL_TABLET | Freq: Three times a day (TID) | ORAL | Status: DC | PRN

## 2014-06-07 NOTE — Patient Instructions (Signed)
You have received a steroid shot. 15% of patients experience increased pain at the injection site with in the next 24 hours. This is best treated with ice and tylenol extra strength 2 tabs every 8 hours. If you are still having pain please call the office.   Meds ordered this encounter  Medications  . HYDROcodone-acetaminophen (NORCO/VICODIN) 5-325 MG per tablet    Sig: Take 1 tablet by mouth every 8 (eight) hours as needed for moderate pain.    Dispense:  90 tablet    Refill:  0

## 2014-06-07 NOTE — Progress Notes (Signed)
Patient ID: [name], female   DOB: August 27, 1926, 80 y.o.   MRN: [id number] Chief Complaint  Patient presents with  . Follow-up    3 month recheck/ bilateral knee injections    Knee  Injection Procedure Note  Pre-operative Diagnosis: left knee oa  Post-operative Diagnosis: same  Indications: pain  Anesthesia: ethyl chloride   Procedure Details   Verbal consent was obtained for the procedure. Time out was completed.The joint was prepped with alcohol, followed by  Ethyl chloride spray and A 20 gauge needle was inserted into the knee via lateral approach; 63ml 1% lidocaine and 1 ml of depomedrol  was then injected into the joint . The needle was removed and the area cleansed and dressed.  Complications:  None; patient tolerated the procedure well. Knee  Injection Procedure Note  Pre-operative Diagnosis: right knee oa  Post-operative Diagnosis: same  Indications: pain  Anesthesia: ethyl chloride   Procedure Details   Verbal consent was obtained for the procedure. Time out was completed.The joint was prepped with alcohol, followed by  Ethyl chloride spray and A 20 gauge needle was inserted into the knee via lateral approach; 14ml 1% lidocaine and 1 ml of depomedrol  was then injected into the joint . The needle was removed and the area cleansed and dressed.  Complications:  None; patient tolerated the procedure well.

## 2014-06-11 DIAGNOSIS — G4700 Insomnia, unspecified: Secondary | ICD-10-CM | POA: Diagnosis not present

## 2014-06-11 DIAGNOSIS — IMO0002 Reserved for concepts with insufficient information to code with codable children: Secondary | ICD-10-CM | POA: Diagnosis not present

## 2014-06-11 DIAGNOSIS — M1710 Unilateral primary osteoarthritis, unspecified knee: Secondary | ICD-10-CM | POA: Diagnosis not present

## 2014-06-11 DIAGNOSIS — I509 Heart failure, unspecified: Secondary | ICD-10-CM | POA: Diagnosis not present

## 2014-06-11 DIAGNOSIS — K5900 Constipation, unspecified: Secondary | ICD-10-CM | POA: Diagnosis not present

## 2014-06-11 DIAGNOSIS — I110 Hypertensive heart disease with heart failure: Secondary | ICD-10-CM | POA: Diagnosis not present

## 2014-06-27 DIAGNOSIS — E781 Pure hyperglyceridemia: Secondary | ICD-10-CM | POA: Diagnosis not present

## 2014-06-27 DIAGNOSIS — E785 Hyperlipidemia, unspecified: Secondary | ICD-10-CM | POA: Diagnosis not present

## 2014-06-27 DIAGNOSIS — D649 Anemia, unspecified: Secondary | ICD-10-CM | POA: Diagnosis not present

## 2014-08-01 DIAGNOSIS — E785 Hyperlipidemia, unspecified: Secondary | ICD-10-CM | POA: Diagnosis not present

## 2014-08-01 DIAGNOSIS — D649 Anemia, unspecified: Secondary | ICD-10-CM | POA: Diagnosis not present

## 2014-08-01 DIAGNOSIS — M1990 Unspecified osteoarthritis, unspecified site: Secondary | ICD-10-CM | POA: Diagnosis not present

## 2014-09-11 ENCOUNTER — Encounter: Payer: Self-pay | Admitting: Orthopedic Surgery

## 2014-09-11 ENCOUNTER — Ambulatory Visit (INDEPENDENT_AMBULATORY_CARE_PROVIDER_SITE_OTHER): Payer: Medicare Other | Admitting: Orthopedic Surgery

## 2014-09-11 VITALS — BP 122/70 | Ht 67.0 in | Wt 147.0 lb

## 2014-09-11 DIAGNOSIS — M1990 Unspecified osteoarthritis, unspecified site: Secondary | ICD-10-CM | POA: Diagnosis not present

## 2014-09-11 DIAGNOSIS — M170 Bilateral primary osteoarthritis of knee: Secondary | ICD-10-CM

## 2014-09-11 MED ORDER — HYDROCODONE-ACETAMINOPHEN 5-325 MG PO TABS: 1.0000 | ORAL_TABLET | Freq: Three times a day (TID) | ORAL | Status: DC | PRN

## 2014-09-11 NOTE — Patient Instructions (Signed)

## 2014-09-11 NOTE — Progress Notes (Signed)
Patient ID: [name], female   DOB: [birth year], 80 y.o.   MRN: [id number] Chief Complaint  Patient presents with  . Knee Pain    requests injections both knees    Procedure note left knee injection verbal consent was obtained to inject left knee joint  Timeout was completed to confirm the site of injection  The medications used were 40 mg of Depo-Medrol and 1% lidocaine 3 cc  Anesthesia was provided by ethyl chloride and the skin was prepped with alcohol.  After cleaning the skin with alcohol a 20-gauge needle was used to inject the left knee joint. There were no complications. A sterile bandage was applied.    Procedure note right knee injection verbal consent was obtained to inject right knee joint  Timeout was completed to confirm the site of injection  The medications used were 40 mg of Depo-Medrol and 1% lidocaine 3 cc  Anesthesia was provided by ethyl chloride and the skin was prepped with alcohol.  After cleaning the skin with alcohol a 20-gauge needle was used to inject the right knee joint. There were no complications. A sterile bandage was applied.   Meds ordered this encounter  Medications  . HYDROcodone-acetaminophen (NORCO/VICODIN) 5-325 MG per tablet    Sig: Take 1 tablet by mouth every 8 (eight) hours as needed for moderate pain.    Dispense:  90 tablet    Refill:  0

## 2014-11-05 DIAGNOSIS — I10 Essential (primary) hypertension: Secondary | ICD-10-CM | POA: Diagnosis not present

## 2014-11-05 DIAGNOSIS — Z23 Encounter for immunization: Secondary | ICD-10-CM | POA: Diagnosis not present

## 2014-12-13 ENCOUNTER — Ambulatory Visit (INDEPENDENT_AMBULATORY_CARE_PROVIDER_SITE_OTHER): Payer: Medicare Other | Admitting: Orthopedic Surgery

## 2014-12-13 VITALS — BP 126/66 | Ht 67.0 in | Wt 147.0 lb

## 2014-12-13 DIAGNOSIS — M1990 Unspecified osteoarthritis, unspecified site: Secondary | ICD-10-CM

## 2014-12-13 DIAGNOSIS — M170 Bilateral primary osteoarthritis of knee: Secondary | ICD-10-CM | POA: Diagnosis not present

## 2014-12-13 MED ORDER — HYDROCODONE-ACETAMINOPHEN 5-325 MG PO TABS: 1.0000 | ORAL_TABLET | Freq: Three times a day (TID) | ORAL | Status: DC | PRN

## 2014-12-13 NOTE — Progress Notes (Signed)
Patient ID: [name], female   DOB: [birth year], 81 y.o.   MRN: [id number] Procedure note left knee injection verbal consent was obtained to inject left knee joint  Timeout was completed to confirm the site of injection  The medications used were 40 mg of Depo-Medrol and 1% lidocaine 3 cc  Anesthesia was provided by ethyl chloride and the skin was prepped with alcohol.  After cleaning the skin with alcohol a 20-gauge needle was used to inject the left knee joint. There were no complications. A sterile bandage was applied.    Procedure note right knee injection verbal consent was obtained to inject right knee joint  Timeout was completed to confirm the site of injection  The medications used were 40 mg of Depo-Medrol and 1% lidocaine 3 cc  Anesthesia was provided by ethyl chloride and the skin was prepped with alcohol.  After cleaning the skin with alcohol a 20-gauge needle was used to inject the right knee joint. There were no complications. A sterile bandage was applied.

## 2015-02-04 DIAGNOSIS — I10 Essential (primary) hypertension: Secondary | ICD-10-CM | POA: Diagnosis not present

## 2015-02-04 DIAGNOSIS — M1990 Unspecified osteoarthritis, unspecified site: Secondary | ICD-10-CM | POA: Diagnosis not present

## 2015-02-04 DIAGNOSIS — E782 Mixed hyperlipidemia: Secondary | ICD-10-CM | POA: Diagnosis not present

## 2015-02-04 DIAGNOSIS — D649 Anemia, unspecified: Secondary | ICD-10-CM | POA: Diagnosis not present

## 2015-03-14 ENCOUNTER — Ambulatory Visit (INDEPENDENT_AMBULATORY_CARE_PROVIDER_SITE_OTHER): Payer: Medicare Other | Admitting: Orthopedic Surgery

## 2015-03-14 VITALS — BP 130/68 | Ht 67.0 in | Wt 135.0 lb

## 2015-03-14 DIAGNOSIS — M1990 Unspecified osteoarthritis, unspecified site: Secondary | ICD-10-CM | POA: Diagnosis not present

## 2015-03-14 DIAGNOSIS — M170 Bilateral primary osteoarthritis of knee: Secondary | ICD-10-CM

## 2015-03-14 MED ORDER — HYDROCODONE-ACETAMINOPHEN 5-325 MG PO TABS: 1.0000 | ORAL_TABLET | Freq: Three times a day (TID) | ORAL | Status: DC | PRN

## 2015-03-14 NOTE — Progress Notes (Signed)
Procedure note left knee injection verbal consent was obtained to inject left knee joint  Timeout was completed to confirm the site of injection  The medications used were 40 mg of Depo-Medrol and 1% lidocaine 3 cc  Anesthesia was provided by ethyl chloride and the skin was prepped with alcohol.  After cleaning the skin with alcohol a 20-gauge needle was used to inject the left knee joint. There were no complications. A sterile bandage was applied.  Procedure note right knee injection verbal consent was obtained to inject right knee joint  Timeout was completed to confirm the site of injection  The medications used were 40 mg of Depo-Medrol and 1% lidocaine 3 cc  Anesthesia was provided by ethyl chloride and the skin was prepped with alcohol.  After cleaning the skin with alcohol a 20-gauge needle was used to inject the right knee joint. There were no complications. A sterile bandage was applied.  

## 2015-05-06 DIAGNOSIS — E785 Hyperlipidemia, unspecified: Secondary | ICD-10-CM | POA: Diagnosis not present

## 2015-05-06 DIAGNOSIS — D649 Anemia, unspecified: Secondary | ICD-10-CM | POA: Diagnosis not present

## 2015-05-06 DIAGNOSIS — I10 Essential (primary) hypertension: Secondary | ICD-10-CM | POA: Diagnosis not present

## 2015-06-18 ENCOUNTER — Ambulatory Visit: Payer: Medicare Other | Admitting: Orthopedic Surgery

## 2015-06-18 ENCOUNTER — Encounter: Payer: Self-pay | Admitting: Orthopedic Surgery

## 2015-07-09 ENCOUNTER — Ambulatory Visit (INDEPENDENT_AMBULATORY_CARE_PROVIDER_SITE_OTHER): Payer: Medicare Other | Admitting: Orthopedic Surgery

## 2015-07-09 ENCOUNTER — Encounter: Payer: Self-pay | Admitting: Orthopedic Surgery

## 2015-07-09 VITALS — BP 134/72 | Ht 67.0 in | Wt 134.0 lb

## 2015-07-09 DIAGNOSIS — M1990 Unspecified osteoarthritis, unspecified site: Secondary | ICD-10-CM | POA: Diagnosis not present

## 2015-07-09 DIAGNOSIS — M170 Bilateral primary osteoarthritis of knee: Secondary | ICD-10-CM

## 2015-07-09 MED ORDER — HYDROCODONE-ACETAMINOPHEN 5-325 MG PO TABS: 1.0000 | ORAL_TABLET | Freq: Three times a day (TID) | ORAL | Status: DC | PRN

## 2015-07-09 NOTE — Progress Notes (Signed)
Procedure note left knee injection verbal consent was obtained to inject left knee joint  Timeout was completed to confirm the site of injection  The medications used were 40 mg of Depo-Medrol and 1% lidocaine 3 cc  Anesthesia was provided by ethyl chloride and the skin was prepped with alcohol.  After cleaning the skin with alcohol a 20-gauge needle was used to inject the left knee joint. There were no complications. A sterile bandage was applied.  Procedure note right knee injection verbal consent was obtained to inject right knee joint  Timeout was completed to confirm the site of injection  The medications used were 40 mg of Depo-Medrol and 1% lidocaine 3 cc  Anesthesia was provided by ethyl chloride and the skin was prepped with alcohol.  After cleaning the skin with alcohol a 20-gauge needle was used to inject the right knee joint. There were no complications. A sterile bandage was applied.  

## 2015-08-05 DIAGNOSIS — D649 Anemia, unspecified: Secondary | ICD-10-CM | POA: Diagnosis not present

## 2015-08-05 DIAGNOSIS — I10 Essential (primary) hypertension: Secondary | ICD-10-CM | POA: Diagnosis not present

## 2015-10-08 ENCOUNTER — Encounter: Payer: Self-pay | Admitting: Orthopedic Surgery

## 2015-10-08 ENCOUNTER — Ambulatory Visit (INDEPENDENT_AMBULATORY_CARE_PROVIDER_SITE_OTHER): Payer: Medicare Other | Admitting: Orthopedic Surgery

## 2015-10-08 VITALS — BP 133/76 | Ht 67.0 in | Wt 134.0 lb

## 2015-10-08 DIAGNOSIS — M1990 Unspecified osteoarthritis, unspecified site: Secondary | ICD-10-CM | POA: Diagnosis not present

## 2015-10-08 DIAGNOSIS — M170 Bilateral primary osteoarthritis of knee: Secondary | ICD-10-CM

## 2015-10-08 MED ORDER — HYDROCODONE-ACETAMINOPHEN 5-325 MG PO TABS: 1.0000 | ORAL_TABLET | Freq: Three times a day (TID) | ORAL | Status: DC | PRN

## 2015-10-08 NOTE — Patient Instructions (Signed)

## 2015-10-08 NOTE — Progress Notes (Signed)
Chief Complaint  Patient presents with  . Follow-up    Bilateral knee injections   Procedure note left knee injection verbal consent was obtained to inject left knee joint  Timeout was completed to confirm the site of injection  The medications used were 40 mg of Depo-Medrol and 1% lidocaine 3 cc  Anesthesia was provided by ethyl chloride and the skin was prepped with alcohol.  After cleaning the skin with alcohol a 20-gauge needle was used to inject the left knee joint. There were no complications. A sterile bandage was applied.   Procedure note right knee injection verbal consent was obtained to inject right knee joint  Timeout was completed to confirm the site of injection  The medications used were 40 mg of Depo-Medrol and 1% lidocaine 3 cc  Anesthesia was provided by ethyl chloride and the skin was prepped with alcohol.  After cleaning the skin with alcohol a 20-gauge needle was used to inject the right knee joint. There were no complications. A sterile bandage was applied.

## 2015-11-26 DIAGNOSIS — M170 Bilateral primary osteoarthritis of knee: Secondary | ICD-10-CM | POA: Diagnosis not present

## 2015-11-26 DIAGNOSIS — D649 Anemia, unspecified: Secondary | ICD-10-CM | POA: Diagnosis not present

## 2015-11-26 DIAGNOSIS — N182 Chronic kidney disease, stage 2 (mild): Secondary | ICD-10-CM | POA: Diagnosis not present

## 2015-11-26 DIAGNOSIS — I10 Essential (primary) hypertension: Secondary | ICD-10-CM | POA: Diagnosis not present

## 2016-01-07 DIAGNOSIS — H25813 Combined forms of age-related cataract, bilateral: Secondary | ICD-10-CM | POA: Diagnosis not present

## 2016-01-07 DIAGNOSIS — H34211 Partial retinal artery occlusion, right eye: Secondary | ICD-10-CM | POA: Diagnosis not present

## 2016-01-09 ENCOUNTER — Ambulatory Visit: Payer: Medicare Other | Admitting: Orthopedic Surgery

## 2016-01-13 ENCOUNTER — Ambulatory Visit (INDEPENDENT_AMBULATORY_CARE_PROVIDER_SITE_OTHER): Payer: Medicare Other | Admitting: Orthopedic Surgery

## 2016-01-13 ENCOUNTER — Encounter: Payer: Self-pay | Admitting: Orthopedic Surgery

## 2016-01-13 VITALS — BP 134/83 | Ht 67.0 in | Wt 124.0 lb

## 2016-01-13 DIAGNOSIS — M25561 Pain in right knee: Secondary | ICD-10-CM

## 2016-01-13 DIAGNOSIS — G8929 Other chronic pain: Secondary | ICD-10-CM

## 2016-01-13 DIAGNOSIS — M25562 Pain in left knee: Secondary | ICD-10-CM | POA: Diagnosis not present

## 2016-01-13 DIAGNOSIS — M170 Bilateral primary osteoarthritis of knee: Secondary | ICD-10-CM | POA: Diagnosis not present

## 2016-01-13 NOTE — Progress Notes (Signed)
Chief Complaint  Patient presents with  . Follow-up    FOLLOW UP BILATERAL KNEES, REPEAT INJECTIONS     Procedure note left knee injection verbal consent was obtained to inject left knee joint  Timeout was completed to confirm the site of injection  The medications used were 40 mg of Depo-Medrol and 1% lidocaine 3 cc  Anesthesia was provided by ethyl chloride and the skin was prepped with alcohol.  After cleaning the skin with alcohol a 20-gauge needle was used to inject the left knee joint. There were no complications. A sterile bandage was applied.   Procedure note right knee injection verbal consent was obtained to inject right knee joint  Timeout was completed to confirm the site of injection  The medications used were 40 mg of Depo-Medrol and 1% lidocaine 3 cc  Anesthesia was provided by ethyl chloride and the skin was prepped with alcohol.  After cleaning the skin with alcohol a 20-gauge needle was used to inject the right knee joint. There were no complications. A sterile bandage was applied.

## 2016-01-15 DIAGNOSIS — I6523 Occlusion and stenosis of bilateral carotid arteries: Secondary | ICD-10-CM | POA: Diagnosis not present

## 2016-02-25 DIAGNOSIS — N189 Chronic kidney disease, unspecified: Secondary | ICD-10-CM | POA: Diagnosis not present

## 2016-02-25 DIAGNOSIS — N182 Chronic kidney disease, stage 2 (mild): Secondary | ICD-10-CM | POA: Diagnosis not present

## 2016-02-25 DIAGNOSIS — I10 Essential (primary) hypertension: Secondary | ICD-10-CM | POA: Diagnosis not present

## 2016-02-25 DIAGNOSIS — D649 Anemia, unspecified: Secondary | ICD-10-CM | POA: Diagnosis not present

## 2016-02-25 DIAGNOSIS — E785 Hyperlipidemia, unspecified: Secondary | ICD-10-CM | POA: Diagnosis not present

## 2016-02-28 DIAGNOSIS — H40033 Anatomical narrow angle, bilateral: Secondary | ICD-10-CM | POA: Diagnosis not present

## 2016-02-28 DIAGNOSIS — H34211 Partial retinal artery occlusion, right eye: Secondary | ICD-10-CM | POA: Diagnosis not present

## 2016-03-18 ENCOUNTER — Other Ambulatory Visit (HOSPITAL_COMMUNITY): Payer: Self-pay | Admitting: Family Medicine

## 2016-03-18 DIAGNOSIS — R130 Aphagia: Secondary | ICD-10-CM | POA: Diagnosis not present

## 2016-03-18 DIAGNOSIS — R1310 Dysphagia, unspecified: Secondary | ICD-10-CM

## 2016-03-18 DIAGNOSIS — R634 Abnormal weight loss: Secondary | ICD-10-CM | POA: Diagnosis not present

## 2016-03-20 DIAGNOSIS — H40033 Anatomical narrow angle, bilateral: Secondary | ICD-10-CM | POA: Diagnosis not present

## 2016-03-25 ENCOUNTER — Ambulatory Visit (HOSPITAL_COMMUNITY)
Admission: RE | Admit: 2016-03-25 | Discharge: 2016-03-25 | Disposition: A | Discharge status: Home / Self Care | Payer: Medicare Other | Source: Ambulatory Visit | Attending: Family Medicine | Admitting: Family Medicine

## 2016-03-25 DIAGNOSIS — R1310 Dysphagia, unspecified: Secondary | ICD-10-CM | POA: Diagnosis not present

## 2016-03-25 DIAGNOSIS — K224 Dyskinesia of esophagus: Secondary | ICD-10-CM | POA: Diagnosis not present

## 2016-03-25 DIAGNOSIS — K222 Esophageal obstruction: Secondary | ICD-10-CM | POA: Insufficient documentation

## 2016-03-30 DIAGNOSIS — K222 Esophageal obstruction: Secondary | ICD-10-CM | POA: Diagnosis not present

## 2016-03-30 DIAGNOSIS — R1314 Dysphagia, pharyngoesophageal phase: Secondary | ICD-10-CM | POA: Diagnosis not present

## 2016-04-06 ENCOUNTER — Ambulatory Visit: Payer: Medicare Other | Admitting: Orthopedic Surgery

## 2016-04-08 ENCOUNTER — Encounter (HOSPITAL_COMMUNITY): Payer: Self-pay | Admitting: *Deleted

## 2016-04-08 ENCOUNTER — Ambulatory Visit: Payer: Medicare Other | Admitting: Podiatry

## 2016-04-20 ENCOUNTER — Ambulatory Visit (INDEPENDENT_AMBULATORY_CARE_PROVIDER_SITE_OTHER): Payer: Medicare Other | Admitting: Orthopedic Surgery

## 2016-04-20 ENCOUNTER — Encounter: Payer: Self-pay | Admitting: Orthopedic Surgery

## 2016-04-20 VITALS — BP 137/67 | Ht 67.0 in | Wt 124.0 lb

## 2016-04-20 DIAGNOSIS — M25562 Pain in left knee: Secondary | ICD-10-CM | POA: Diagnosis not present

## 2016-04-20 DIAGNOSIS — M25561 Pain in right knee: Secondary | ICD-10-CM

## 2016-04-20 MED ORDER — HYDROCODONE-ACETAMINOPHEN 5-325 MG PO TABS: 1.0000 | ORAL_TABLET | Freq: Three times a day (TID) | ORAL | Status: DC | PRN

## 2016-04-20 NOTE — Progress Notes (Signed)
  Procedure note left knee injection verbal consent was obtained to inject left knee joint  Timeout was completed to confirm the site of injection  The medications used were 40 mg of Depo-Medrol and 1% lidocaine 3 cc  Anesthesia was provided by ethyl chloride and the skin was prepped with alcohol.  After cleaning the skin with alcohol a 20-gauge needle was used to inject the left knee joint. There were no complications. A sterile bandage was applied.   Procedure note right knee injection verbal consent was obtained to inject right knee joint  Timeout was completed to confirm the site of injection  The medications used were 40 mg of Depo-Medrol and 1% lidocaine 3 cc  Anesthesia was provided by ethyl chloride and the skin was prepped with alcohol.  After cleaning the skin with alcohol a 20-gauge needle was used to inject the right knee joint. There were no complications. A sterile bandage was applied.  Arther Abbott, MD 04/20/2016 2:58 PM

## 2016-04-21 ENCOUNTER — Other Ambulatory Visit: Payer: Self-pay | Admitting: Gastroenterology

## 2016-04-21 ENCOUNTER — Ambulatory Visit (HOSPITAL_COMMUNITY)
Admission: RE | Admit: 2016-04-21 | Discharge: 2016-04-21 | Disposition: A | Discharge status: Home / Self Care | Payer: Medicare Other | Source: Ambulatory Visit | Attending: Gastroenterology | Admitting: Gastroenterology

## 2016-04-21 ENCOUNTER — Encounter (HOSPITAL_COMMUNITY): Payer: Self-pay

## 2016-04-21 ENCOUNTER — Encounter (HOSPITAL_COMMUNITY)
Admission: RE | Disposition: A | Discharge status: Home / Self Care | Payer: Self-pay | Source: Ambulatory Visit | Attending: Gastroenterology

## 2016-04-21 ENCOUNTER — Ambulatory Visit (HOSPITAL_COMMUNITY): Payer: Medicare Other | Admitting: Anesthesiology

## 2016-04-21 DIAGNOSIS — K222 Esophageal obstruction: Secondary | ICD-10-CM | POA: Diagnosis not present

## 2016-04-21 DIAGNOSIS — M1990 Unspecified osteoarthritis, unspecified site: Secondary | ICD-10-CM | POA: Insufficient documentation

## 2016-04-21 DIAGNOSIS — I129 Hypertensive chronic kidney disease with stage 1 through stage 4 chronic kidney disease, or unspecified chronic kidney disease: Secondary | ICD-10-CM | POA: Diagnosis not present

## 2016-04-21 DIAGNOSIS — K449 Diaphragmatic hernia without obstruction or gangrene: Secondary | ICD-10-CM | POA: Diagnosis not present

## 2016-04-21 DIAGNOSIS — N189 Chronic kidney disease, unspecified: Secondary | ICD-10-CM | POA: Diagnosis not present

## 2016-04-21 DIAGNOSIS — K259 Gastric ulcer, unspecified as acute or chronic, without hemorrhage or perforation: Secondary | ICD-10-CM | POA: Diagnosis not present

## 2016-04-21 DIAGNOSIS — K2950 Unspecified chronic gastritis without bleeding: Secondary | ICD-10-CM | POA: Diagnosis not present

## 2016-04-21 DIAGNOSIS — K269 Duodenal ulcer, unspecified as acute or chronic, without hemorrhage or perforation: Secondary | ICD-10-CM | POA: Diagnosis not present

## 2016-04-21 DIAGNOSIS — I10 Essential (primary) hypertension: Secondary | ICD-10-CM | POA: Diagnosis not present

## 2016-04-21 HISTORY — PX: ESOPHAGOGASTRODUODENOSCOPY (EGD) WITH PROPOFOL: SHX5813

## 2016-04-21 HISTORY — PX: BALLOON DILATION: SHX5330

## 2016-04-21 SURGERY — ESOPHAGOGASTRODUODENOSCOPY (EGD) WITH PROPOFOL
Anesthesia: Monitor Anesthesia Care

## 2016-04-21 MED ORDER — PROPOFOL 10 MG/ML IV BOLUS
INTRAVENOUS | Status: DC | PRN
  Administered 2016-04-21: 30 mg via INTRAVENOUS
  Administered 2016-04-21: 20 mg via INTRAVENOUS

## 2016-04-21 MED ORDER — SODIUM CHLORIDE 0.9 % IV SOLN: INTRAVENOUS | Status: DC

## 2016-04-21 MED ORDER — PROPOFOL 500 MG/50ML IV EMUL
INTRAVENOUS | Status: DC | PRN
  Administered 2016-04-21: 50 ug/kg/min via INTRAVENOUS

## 2016-04-21 MED ORDER — PROPOFOL 10 MG/ML IV BOLUS
INTRAVENOUS | Status: AC
  Filled 2016-04-21: qty 20

## 2016-04-21 MED ORDER — LACTATED RINGERS IV SOLN
INTRAVENOUS | Status: DC
Start: 2016-04-21 — End: 2016-04-21
  Administered 2016-04-21: 12:00:00 via INTRAVENOUS

## 2016-04-21 SURGICAL SUPPLY — 15 items

## 2016-04-21 NOTE — Anesthesia Preprocedure Evaluation (Signed)
Anesthesia Evaluation  Patient identified by MRN, date of birth, ID band Patient awake    Reviewed: Allergy & Precautions, NPO status   Airway Mallampati: I  TM Distance: >3 FB Neck ROM: Full    Dental   Pulmonary neg pulmonary ROS,    breath sounds clear to auscultation       Cardiovascular hypertension,  Rhythm:Regular Rate:Normal     Neuro/Psych    GI/Hepatic negative GI ROS, Neg liver ROS,   Endo/Other    Renal/GU Renal disease     Musculoskeletal  (+) Arthritis ,   Abdominal   Peds  Hematology   Anesthesia Other Findings   Reproductive/Obstetrics                             Anesthesia Physical Anesthesia Plan  ASA: III  Anesthesia Plan: MAC   Post-op Pain Management:    Induction: Intravenous  Airway Management Planned: Simple Face Mask  Additional Equipment:   Intra-op Plan:   Post-operative Plan:   Informed Consent: I have reviewed the patients History and Physical, chart, labs and discussed the procedure including the risks, benefits and alternatives for the proposed anesthesia with the patient or authorized representative who has indicated his/her understanding and acceptance.   Dental advisory given  Plan Discussed with: CRNA and Anesthesiologist  Anesthesia Plan Comments:         Anesthesia Quick Evaluation

## 2016-04-21 NOTE — Anesthesia Postprocedure Evaluation (Signed)
Anesthesia Post Note  Patient: [name]  Procedure(s) Performed: Procedure(s) (LRB): ESOPHAGOGASTRODUODENOSCOPY (EGD) WITH PROPOFOL (N/A) BALLOON DILATION (N/A)  Patient location during evaluation: PACU Anesthesia Type: MAC Level of consciousness: awake Pain management: pain level controlled Vital Signs Assessment: post-procedure vital signs reviewed and stable Respiratory status: spontaneous breathing Cardiovascular status: stable Anesthetic complications: no    Last Vitals:  Filed Vitals:   04/21/16 1109 04/21/16 1149  BP: 163/70 150/82  Pulse: 71 70  Temp: 36.7 C   Resp: 13 15    Last Pain: There were no vitals filed for this visit.               EDWARDS,Daffney Greenly

## 2016-04-21 NOTE — H&P (Signed)
  The patient presents to the endoscopy department as an outpatient for EGD with dilatation of a distal esophageal stenosis which was seen on barium swallow and impeded the passage of a barium tablet. She has been experiencing dysphagia.  Physical:  She is in no distress  Nonicteric  Heart regular rhythm no murmurs  Lungs clear  Abdomen: Bowel sounds present, soft, nontender  Impression: Dysphagia with distal esophageal stricture  Plan: EGD with dilatation

## 2016-04-21 NOTE — Op Note (Signed)
[facility] Patient Name: [name] Procedure Date: 04/21/2016 MRN: [id number] Attending MD: Wonda Horner , MD Date of Birth: May 09, 1926 CSN: [id number] Age: 82 Admit Type: Outpatient Procedure:                Upper GI endoscopy Indications:              Dysphagia Providers:                Wonda Horner, MD, Kingsley Plan, RN, Corliss Parish, Technician, Delena Bali, CRNA Referring MD:              Medicines:                Propofol per Anesthesia Complications:            No immediate complications. Estimated Blood Loss:     Estimated blood loss was minimal. Procedure:                Pre-Anesthesia Assessment:                           - Prior to the procedure, a History and Physical                            was performed, and patient medications and                            allergies were reviewed. The patient's tolerance of                            previous anesthesia was also reviewed. The risks                            and benefits of the procedure and the sedation                            options and risks were discussed with the patient.                            All questions were answered, and informed consent                            was obtained. Prior Anticoagulants: The patient has                            taken no previous anticoagulant or antiplatelet                            agents. ASA Grade Assessment: II - A patient with                            mild systemic disease. After reviewing the risks  and benefits, the patient was deemed in                            satisfactory condition to undergo the procedure.                           After obtaining informed consent, the endoscope was                            passed under direct vision. Throughout the                            procedure, the patient's blood pressure, pulse, and                            oxygen  saturations were monitored continuously. The                            [id number] scope was introduced through the                            mouth, and advanced to the duodenal bulb. The upper                            GI endoscopy was accomplished without difficulty.                            The patient tolerated the procedure well. Scope In: Scope Out: Findings:      One moderate stenosis was found. And was traversed after dilation. A TTS       dilator was passed through the scope. Dilation with a 08-27-11 mm       balloon dilator was performed to 12 mm.      A small hiatal hernia was present.      One cratered gastric ulcer was found in the gastric antrum. The lesion       was 4 mm in largest dimension. Biopsies were taken with a cold forceps       for histology.      One cratered duodenal ulcer was found in the duodenal bulb. It appeared       large and there was stenosis of the distal bulb. Impression:               - Esophageal stenosis. Dilated.                           - Small hiatal hernia.                           - Gastric ulcer. Biopsied.                           - One duodenal ulcer. Moderate Sedation:      . Recommendation:           - Advance diet as tolerated.                           -  Continue present medications.                           - Stop Naproxen sodium as it can cause ulcers.                           - Use Nexium (esomeprazole) 40 mg PO daily. Procedure Code(s):        --- Professional ---                           [id number], Esophagogastroduodenoscopy, flexible,                            transoral; with transendoscopic balloon dilation of                            esophagus (less than 30 mm diameter)                           43239, Esophagogastroduodenoscopy, flexible,                            transoral; with biopsy, single or multiple Diagnosis Code(s):        --- Professional ---                           K22.2, Esophageal  obstruction                           K44.9, Diaphragmatic hernia without obstruction or                            gangrene                           K25.9, Gastric ulcer, unspecified as acute or                            chronic, without hemorrhage or perforation                           K26.9, Duodenal ulcer, unspecified as acute or                            chronic, without hemorrhage or perforation                           R13.10, Dysphagia, unspecified CPT copyright 2016 American Medical Association. All rights reserved. The codes documented in this report are preliminary and upon coder review may  be revised to meet current compliance requirements. Anson Fret, MD Wonda Horner, MD 04/21/2016 12:03:03 PM This report has been signed electronically. Number of Addenda: 0

## 2016-04-21 NOTE — Discharge Instructions (Signed)

## 2016-04-21 NOTE — Transfer of Care (Signed)
Immediate Anesthesia Transfer of Care Note  Patient: [name]  Procedure(s) Performed: Procedure(s): ESOPHAGOGASTRODUODENOSCOPY (EGD) WITH PROPOFOL (N/A) BALLOON DILATION (N/A)  Patient Location: PACU  Anesthesia Type:MAC  Level of Consciousness:  sedated, patient cooperative and responds to stimulation  Airway & Oxygen Therapy:Patient Spontanous Breathing and Patient connected to face mask oxgen  Post-op Assessment:  Report given to PACU RN and Post -op Vital signs reviewed and stable  Post vital signs:  Reviewed and stable  Last Vitals:  Filed Vitals:   04/21/16 1109  BP: 163/70  Pulse: 71  Temp: 36.7 C  Resp: 13    Complications: No apparent anesthesia complications

## 2016-04-23 ENCOUNTER — Encounter (HOSPITAL_COMMUNITY): Payer: Self-pay | Admitting: Gastroenterology

## 2016-05-27 DIAGNOSIS — K269 Duodenal ulcer, unspecified as acute or chronic, without hemorrhage or perforation: Secondary | ICD-10-CM | POA: Diagnosis not present

## 2016-05-27 DIAGNOSIS — K259 Gastric ulcer, unspecified as acute or chronic, without hemorrhage or perforation: Secondary | ICD-10-CM | POA: Diagnosis not present

## 2016-05-27 DIAGNOSIS — K222 Esophageal obstruction: Secondary | ICD-10-CM | POA: Diagnosis not present

## 2016-06-04 DIAGNOSIS — H2513 Age-related nuclear cataract, bilateral: Secondary | ICD-10-CM | POA: Diagnosis not present

## 2016-06-04 DIAGNOSIS — H524 Presbyopia: Secondary | ICD-10-CM | POA: Diagnosis not present

## 2016-06-16 DIAGNOSIS — F0390 Unspecified dementia without behavioral disturbance: Secondary | ICD-10-CM | POA: Diagnosis not present

## 2016-06-16 DIAGNOSIS — D649 Anemia, unspecified: Secondary | ICD-10-CM | POA: Diagnosis not present

## 2016-06-16 DIAGNOSIS — M159 Polyosteoarthritis, unspecified: Secondary | ICD-10-CM | POA: Diagnosis not present

## 2016-07-22 ENCOUNTER — Ambulatory Visit: Payer: Medicare Other | Admitting: Orthopedic Surgery

## 2016-07-27 ENCOUNTER — Ambulatory Visit (INDEPENDENT_AMBULATORY_CARE_PROVIDER_SITE_OTHER): Payer: Medicare Other | Admitting: Orthopedic Surgery

## 2016-07-27 ENCOUNTER — Other Ambulatory Visit: Payer: Self-pay | Admitting: Orthopedic Surgery

## 2016-07-27 ENCOUNTER — Telehealth: Payer: Self-pay | Admitting: Orthopedic Surgery

## 2016-07-27 VITALS — BP 136/76 | HR 76 | Ht 64.0 in | Wt 120.0 lb

## 2016-07-27 DIAGNOSIS — M25562 Pain in left knee: Principal | ICD-10-CM

## 2016-07-27 DIAGNOSIS — M25561 Pain in right knee: Secondary | ICD-10-CM

## 2016-07-27 DIAGNOSIS — G8929 Other chronic pain: Secondary | ICD-10-CM

## 2016-07-27 MED ORDER — HYDROCODONE-ACETAMINOPHEN 5-325 MG PO TABS: 1.0000 | ORAL_TABLET | Freq: Three times a day (TID) | ORAL | 0 refills | Status: DC | PRN

## 2016-07-27 NOTE — Telephone Encounter (Signed)
Hydrocodone-Acetaminophen  5/325 mg  Qty 90 Tablets °

## 2016-07-27 NOTE — Telephone Encounter (Signed)
ROUTING TO DR HARRISON FOR APPROVAL 

## 2016-07-27 NOTE — Progress Notes (Signed)
Chief Complaint  Patient presents with  . Follow-up    Recheck bilateral knees with injections.    BP 136/76   Pulse 76   Ht 5\' 4"  (1.626 m)   Wt 120 lb (54.4 kg)   BMI 20.60 kg/m    Procedure note left knee injection verbal consent was obtained to inject left knee joint  Timeout was completed to confirm the site of injection  The medications used were 40 mg of Depo-Medrol and 1% lidocaine 3 cc  Anesthesia was provided by ethyl chloride and the skin was prepped with alcohol.  After cleaning the skin with alcohol a 20-gauge needle was used to inject the left knee joint. There were no complications. A sterile bandage was applied.   Procedure note right knee injection verbal consent was obtained to inject right knee joint  Timeout was completed to confirm the site of injection  The medications used were 40 mg of Depo-Medrol and 1% lidocaine 3 cc  Anesthesia was provided by ethyl chloride and the skin was prepped with alcohol.  After cleaning the skin with alcohol a 20-gauge needle was used to inject the right knee joint. There were no complications. A sterile bandage was applied.

## 2016-07-27 NOTE — Patient Instructions (Signed)

## 2016-08-10 DIAGNOSIS — H2512 Age-related nuclear cataract, left eye: Secondary | ICD-10-CM | POA: Diagnosis not present

## 2016-08-17 DIAGNOSIS — H25812 Combined forms of age-related cataract, left eye: Secondary | ICD-10-CM | POA: Diagnosis not present

## 2016-08-17 DIAGNOSIS — H2512 Age-related nuclear cataract, left eye: Secondary | ICD-10-CM | POA: Diagnosis not present

## 2016-08-24 DIAGNOSIS — R1310 Dysphagia, unspecified: Secondary | ICD-10-CM | POA: Diagnosis not present

## 2016-08-31 DIAGNOSIS — H2511 Age-related nuclear cataract, right eye: Secondary | ICD-10-CM | POA: Diagnosis not present

## 2016-09-21 DIAGNOSIS — H2511 Age-related nuclear cataract, right eye: Secondary | ICD-10-CM | POA: Diagnosis not present

## 2016-09-21 DIAGNOSIS — H25811 Combined forms of age-related cataract, right eye: Secondary | ICD-10-CM | POA: Diagnosis not present

## 2016-09-28 DIAGNOSIS — F0390 Unspecified dementia without behavioral disturbance: Secondary | ICD-10-CM | POA: Diagnosis not present

## 2016-09-28 DIAGNOSIS — E785 Hyperlipidemia, unspecified: Secondary | ICD-10-CM | POA: Diagnosis not present

## 2016-09-28 DIAGNOSIS — Z23 Encounter for immunization: Secondary | ICD-10-CM | POA: Diagnosis not present

## 2016-10-26 ENCOUNTER — Ambulatory Visit (INDEPENDENT_AMBULATORY_CARE_PROVIDER_SITE_OTHER): Payer: Commercial Managed Care - HMO | Admitting: Orthopedic Surgery

## 2016-10-26 DIAGNOSIS — M25561 Pain in right knee: Secondary | ICD-10-CM

## 2016-10-26 DIAGNOSIS — M25562 Pain in left knee: Secondary | ICD-10-CM

## 2016-10-26 DIAGNOSIS — G8929 Other chronic pain: Secondary | ICD-10-CM

## 2016-10-26 MED ORDER — HYDROCODONE-ACETAMINOPHEN 5-325 MG PO TABS: 1.0000 | ORAL_TABLET | Freq: Three times a day (TID) | ORAL | 0 refills | Status: DC | PRN

## 2016-10-26 NOTE — Patient Instructions (Signed)

## 2016-10-26 NOTE — Progress Notes (Signed)
  Procedure note left knee injection verbal consent was obtained to inject left knee joint  Timeout was completed to confirm the site of injection  The medications used were 40 mg of Depo-Medrol and 1% lidocaine 3 cc  Anesthesia was provided by ethyl chloride and the skin was prepped with alcohol.  After cleaning the skin with alcohol a 20-gauge needle was used to inject the left knee joint. There were no complications. A sterile bandage was applied.  Procedure note right knee injection verbal consent was obtained to inject right knee joint  Timeout was completed to confirm the site of injection  The medications used were 40 mg of Depo-Medrol and 1% lidocaine 3 cc  Anesthesia was provided by ethyl chloride and the skin was prepped with alcohol.  After cleaning the skin with alcohol a 20-gauge needle was used to inject the right knee joint. There were no complications. A sterile bandage was applied.  

## 2017-01-04 DIAGNOSIS — K5730 Diverticulosis of large intestine without perforation or abscess without bleeding: Secondary | ICD-10-CM | POA: Diagnosis not present

## 2017-01-04 DIAGNOSIS — D649 Anemia, unspecified: Secondary | ICD-10-CM | POA: Diagnosis not present

## 2017-01-04 DIAGNOSIS — Z79899 Other long term (current) drug therapy: Secondary | ICD-10-CM | POA: Diagnosis not present

## 2017-01-04 DIAGNOSIS — N649 Disorder of breast, unspecified: Secondary | ICD-10-CM | POA: Diagnosis not present

## 2017-01-18 DIAGNOSIS — H2511 Age-related nuclear cataract, right eye: Secondary | ICD-10-CM | POA: Diagnosis not present

## 2017-01-18 DIAGNOSIS — Z961 Presence of intraocular lens: Secondary | ICD-10-CM | POA: Diagnosis not present

## 2017-01-18 DIAGNOSIS — H2512 Age-related nuclear cataract, left eye: Secondary | ICD-10-CM | POA: Diagnosis not present

## 2017-01-25 ENCOUNTER — Ambulatory Visit: Payer: Commercial Managed Care - HMO | Admitting: Orthopedic Surgery

## 2017-02-01 ENCOUNTER — Ambulatory Visit (INDEPENDENT_AMBULATORY_CARE_PROVIDER_SITE_OTHER): Payer: Medicare HMO | Admitting: Orthopedic Surgery

## 2017-02-01 ENCOUNTER — Encounter: Payer: Self-pay | Admitting: Orthopedic Surgery

## 2017-02-01 DIAGNOSIS — M25561 Pain in right knee: Secondary | ICD-10-CM

## 2017-02-01 DIAGNOSIS — G8929 Other chronic pain: Secondary | ICD-10-CM | POA: Diagnosis not present

## 2017-02-01 DIAGNOSIS — M25562 Pain in left knee: Secondary | ICD-10-CM

## 2017-02-01 DIAGNOSIS — M170 Bilateral primary osteoarthritis of knee: Secondary | ICD-10-CM

## 2017-02-01 MED ORDER — ALPRAZOLAM 0.5 MG PO TABS: 0.5000 mg | ORAL_TABLET | Freq: Every evening | ORAL | 5 refills | Status: DC | PRN

## 2017-02-01 MED ORDER — HYDROCODONE-ACETAMINOPHEN 5-325 MG PO TABS: 1.0000 | ORAL_TABLET | Freq: Three times a day (TID) | ORAL | 0 refills | Status: DC | PRN

## 2017-02-01 NOTE — Progress Notes (Signed)
Chief Complaint  Patient presents with  . Follow-up    INJECTIONS, BILATERAL KNEES     Procedure note left knee injection verbal consent was obtained to inject left knee joint  Timeout was completed to confirm the site of injection  The medications used were 40 mg of Depo-Medrol and 1% lidocaine 3 cc  Anesthesia was provided by ethyl chloride and the skin was prepped with alcohol.  After cleaning the skin with alcohol a 20-gauge needle was used to inject the left knee joint. There were no complications. A sterile bandage was applied.   Procedure note right knee injection verbal consent was obtained to inject right knee joint  Timeout was completed to confirm the site of injection  The medications used were 40 mg of Depo-Medrol and 1% lidocaine 3 cc  Anesthesia was provided by ethyl chloride and the skin was prepped with alcohol.  After cleaning the skin with alcohol a 20-gauge needle was used to inject the right knee joint. There were no complications. A sterile bandage was applied.  FU 3 MONTHS

## 2017-02-01 NOTE — Patient Instructions (Signed)

## 2017-04-05 DIAGNOSIS — I10 Essential (primary) hypertension: Secondary | ICD-10-CM | POA: Diagnosis not present

## 2017-04-05 DIAGNOSIS — S5011XA Contusion of right forearm, initial encounter: Secondary | ICD-10-CM | POA: Diagnosis not present

## 2017-04-05 DIAGNOSIS — D649 Anemia, unspecified: Secondary | ICD-10-CM | POA: Diagnosis not present

## 2017-04-21 ENCOUNTER — Ambulatory Visit: Payer: Medicare HMO | Admitting: Podiatry

## 2017-05-07 ENCOUNTER — Ambulatory Visit: Payer: Medicare HMO | Admitting: Podiatry

## 2017-05-11 ENCOUNTER — Ambulatory Visit (INDEPENDENT_AMBULATORY_CARE_PROVIDER_SITE_OTHER): Payer: Medicare HMO | Admitting: Orthopedic Surgery

## 2017-05-11 DIAGNOSIS — G8929 Other chronic pain: Secondary | ICD-10-CM

## 2017-05-11 DIAGNOSIS — M25561 Pain in right knee: Secondary | ICD-10-CM | POA: Diagnosis not present

## 2017-05-11 DIAGNOSIS — M25562 Pain in left knee: Secondary | ICD-10-CM | POA: Diagnosis not present

## 2017-05-11 MED ORDER — HYDROCODONE-ACETAMINOPHEN 5-325 MG PO TABS: 1.0000 | ORAL_TABLET | Freq: Three times a day (TID) | ORAL | 0 refills | Status: DC | PRN

## 2017-05-11 NOTE — Progress Notes (Addendum)
  Follow up   Bilateral knee pain   Procedure note left knee injection verbal consent was obtained to inject left knee joint  Timeout was completed to confirm the site of injection  The medications used were 40 mg of Depo-Medrol and 1% lidocaine 3 cc  Anesthesia was provided by ethyl chloride and the skin was prepped with alcohol.  After cleaning the skin with alcohol a 20-gauge needle was used to inject the left knee joint. There were no complications. A sterile bandage was applied.   Procedure note right knee injection verbal consent was obtained to inject right knee joint  Timeout was completed to confirm the site of injection  The medications used were 40 mg of Depo-Medrol and 1% lidocaine 3 cc  Anesthesia was provided by ethyl chloride and the skin was prepped with alcohol.  After cleaning the skin with alcohol a 20-gauge needle was used to inject the right knee joint. There were no complications. A sterile bandage was applied.  Meds ordered this encounter  Medications  . HYDROcodone-acetaminophen (NORCO) 5-325 MG tablet    Sig: Take 1 tablet by mouth every 8 (eight) hours as needed for moderate pain.    Dispense:  90 tablet    Refill:  0    Fu 3 months

## 2017-06-08 ENCOUNTER — Encounter: Payer: Self-pay | Admitting: Sports Medicine

## 2017-06-08 ENCOUNTER — Ambulatory Visit (INDEPENDENT_AMBULATORY_CARE_PROVIDER_SITE_OTHER): Payer: Medicare HMO | Admitting: Sports Medicine

## 2017-06-08 DIAGNOSIS — M79675 Pain in left toe(s): Secondary | ICD-10-CM | POA: Diagnosis not present

## 2017-06-08 DIAGNOSIS — B351 Tinea unguium: Secondary | ICD-10-CM | POA: Diagnosis not present

## 2017-06-08 DIAGNOSIS — M79674 Pain in right toe(s): Secondary | ICD-10-CM

## 2017-06-08 DIAGNOSIS — I739 Peripheral vascular disease, unspecified: Secondary | ICD-10-CM

## 2017-06-08 NOTE — Progress Notes (Signed)
   Subjective:    Patient ID: [name], female    DOB: [birth year], 83 y.o.   MRN: [id number]  HPI  My left great toe nail is growing wrong.     Review of Systems  All other systems reviewed and are negative.      Objective:   Physical Exam        Assessment & Plan:

## 2017-06-08 NOTE — Progress Notes (Signed)
Subjective: [name] is a 84 y.o. female patient seen today in office with complaint of painful thickened and elongated toenails; unable to trim especially the left 1st toenail. Patient denies history of Diabetes, Neuropathy, or known Vascular disease. Patient has no other pedal complaints at this time.   Patient Active Problem List   Diagnosis Date Noted  . Dementia, senile, with, delirium 10/06/2011  . Anemia 10/06/2011  . Hyperglycemia 10/06/2011  . Gait abnormality 10/05/2011  . Dehydration 10/05/2011  . Hypokalemia 10/05/2011  . Osteoarthritis 10/05/2011  . Dementia 10/05/2011  . ARF (acute renal failure) ([place]) 10/05/2011  . Knee pain 09/10/2011  . OA (osteoarthritis) of knee 09/10/2011  . KNEE, ARTHRITIS, DEGEN./OSTEO 09/11/2009  . KNEE PAIN 01/21/2009  . FIBROMYALGIA/FIBROMYOSITIS 12/10/2008  . FRACTURE, RADIUS, DISTAL 12/10/2008  . WRIST PAIN 09/06/2007  . COLLES' FRACTURE, RIGHT 08/24/2007  . HEMANGIOMA, HEPATIC 09/23/2006  . HYPERLIPIDEMIA 09/23/2006  . ANEMIA, NORMOCYTIC 09/23/2006  . DEPRESSION 09/23/2006  . HYPERTENSION 09/23/2006  . DIVERTICULOSIS, COLON 09/23/2006  . OSTEOPENIA 09/23/2006  . PULMONARY EMBOLISM, HX OF 09/23/2006    Current Outpatient Prescriptions on File Prior to Visit  Medication Sig Dispense Refill  . ALPRAZolam (XANAX) 0.5 MG tablet Take 1 tablet (0.5 mg total) by mouth at bedtime as needed for anxiety. 30 tablet 5  . hydrochlorothiazide (HYDRODIURIL) 25 MG tablet Take 25 mg by mouth daily.    Marland Kitchen HYDROcodone-acetaminophen (NORCO) 5-325 MG tablet Take 1 tablet by mouth every 8 (eight) hours as needed for moderate pain. 90 tablet 0  . memantine (NAMENDA) 5 MG tablet Take 5 mg by mouth 2 (two) times daily.    . Menthol (ICY HOT) 5 % PTCH Place 1 patch onto the skin every 8 (eight) hours as needed (For pain.).    Marland Kitchen metoprolol succinate (TOPROL-XL) 50 MG 24 hr tablet Take 50 mg by mouth daily.    Marland Kitchen omeprazole (PRILOSEC) 10 MG capsule Take 10  mg by mouth daily.     No current facility-administered medications on file prior to visit.     Allergies  Allergen Reactions  . Erythromycin Other (See Comments)    Reaction unknown    Objective: Physical Exam  General: Well developed, nourished, no acute distress, awake, alert and oriented x 2 assisted by son this visit   Vascular: Dorsalis pedis artery 1/4 bilateral, Posterior tibial artery 1/4 bilateral, skin temperature warm to warm proximal to distal bilateral lower extremities, mild varicosities, decreased pedal hair present bilateral.  Neurological: Gross sensation present via light touch bilateral.   Dermatological: Skin is warm, dry, and supple bilateral, Nails 1-10 are tender, long, thick, and discolored with moderate subungal debris at left 1st toenail, no webspace macerations present bilateral, no open lesions present bilateral, no callus/corns/hyperkeratotic tissue present bilateral. No signs of infection bilateral.  Musculoskeletal: Asymptomatic bunion and hammertoe with dislocation boney deformities noted bilateral. Muscular strength within normal limits without pain on range of motion. No pain with calf compression bilateral.  Assessment and Plan:  Problem List Items Addressed This Visit    None    Visit Diagnoses    Pain due to onychomycosis of toenails of both feet    -  Primary   left 1st toe nail severely thick and long   PVD (peripheral vascular disease) ([place])          -Examined patient.  -Discussed treatment options for painful mycotic nails. -Mechanically debrided and reduced mycotic nails with sterile nail nipper and dremel nail file without incident. -  Recommend good supportive shoes for foot type and wheelchair assistance for stability with ambulation  -Patient to return in 3 months for follow up evaluation or sooner if symptoms worsen.  Landis Martins, DPM

## 2017-07-06 DIAGNOSIS — D649 Anemia, unspecified: Secondary | ICD-10-CM | POA: Diagnosis not present

## 2017-07-06 DIAGNOSIS — N189 Chronic kidney disease, unspecified: Secondary | ICD-10-CM | POA: Diagnosis not present

## 2017-08-10 ENCOUNTER — Ambulatory Visit: Payer: Medicare HMO | Admitting: Orthopedic Surgery

## 2017-08-11 ENCOUNTER — Ambulatory Visit (INDEPENDENT_AMBULATORY_CARE_PROVIDER_SITE_OTHER): Payer: Medicare HMO | Admitting: Orthopedic Surgery

## 2017-08-11 ENCOUNTER — Encounter: Payer: Self-pay | Admitting: Orthopedic Surgery

## 2017-08-11 VITALS — BP 152/81 | HR 81 | Ht 64.0 in | Wt 131.0 lb

## 2017-08-11 DIAGNOSIS — G8929 Other chronic pain: Secondary | ICD-10-CM | POA: Diagnosis not present

## 2017-08-11 DIAGNOSIS — M25562 Pain in left knee: Secondary | ICD-10-CM

## 2017-08-11 DIAGNOSIS — M25561 Pain in right knee: Secondary | ICD-10-CM

## 2017-08-11 NOTE — Progress Notes (Signed)
Chief Complaint  Patient presents with  . Follow-up    Bilateral Knee Injections     Procedure note left knee injection verbal consent was obtained to inject left knee joint  Timeout was completed to confirm the site of injection  The medications used were 40 mg of Depo-Medrol and 1% lidocaine 3 cc  Anesthesia was provided by ethyl chloride and the skin was prepped with alcohol.  After cleaning the skin with alcohol a 20-gauge needle was used to inject the left knee joint. There were no complications. A sterile bandage was applied.   Procedure note right knee injection verbal consent was obtained to inject right knee joint  Timeout was completed to confirm the site of injection  The medications used were 40 mg of Depo-Medrol and 1% lidocaine 3 cc  Anesthesia was provided by ethyl chloride and the skin was prepped with alcohol.  After cleaning the skin with alcohol a 20-gauge needle was used to inject the right knee joint. There were no complications. A sterile bandage was applied.  Encounter Diagnoses  Name Primary?  . Bilateral chronic knee pain Yes  . Arthralgia of both lower legs

## 2017-08-20 ENCOUNTER — Other Ambulatory Visit: Payer: Self-pay | Admitting: Orthopedic Surgery

## 2017-08-23 ENCOUNTER — Other Ambulatory Visit: Payer: Self-pay | Admitting: Orthopedic Surgery

## 2017-08-23 MED ORDER — ALPRAZOLAM 0.5 MG PO TABS: 0.5000 mg | ORAL_TABLET | Freq: Every evening | ORAL | 5 refills | Status: DC | PRN

## 2017-08-23 NOTE — Telephone Encounter (Signed)
Done already

## 2017-08-26 ENCOUNTER — Other Ambulatory Visit: Payer: Self-pay | Admitting: Orthopedic Surgery

## 2017-08-31 ENCOUNTER — Other Ambulatory Visit: Payer: Self-pay | Admitting: Orthopedic Surgery

## 2017-09-01 ENCOUNTER — Other Ambulatory Visit: Payer: Self-pay | Admitting: Radiology

## 2017-09-01 NOTE — Telephone Encounter (Signed)
Called in Alprazolam.

## 2017-09-07 ENCOUNTER — Encounter: Payer: Medicare HMO | Admitting: Sports Medicine

## 2017-09-07 NOTE — Progress Notes (Signed)
Patient was a no show.   This encounter was created in error - please disregard. 

## 2017-09-13 DIAGNOSIS — F0390 Unspecified dementia without behavioral disturbance: Secondary | ICD-10-CM | POA: Diagnosis not present

## 2017-09-13 DIAGNOSIS — M1711 Unilateral primary osteoarthritis, right knee: Secondary | ICD-10-CM | POA: Diagnosis not present

## 2017-09-13 DIAGNOSIS — I10 Essential (primary) hypertension: Secondary | ICD-10-CM | POA: Diagnosis not present

## 2017-09-15 DIAGNOSIS — I10 Essential (primary) hypertension: Secondary | ICD-10-CM | POA: Diagnosis not present

## 2017-09-15 DIAGNOSIS — F0390 Unspecified dementia without behavioral disturbance: Secondary | ICD-10-CM | POA: Diagnosis not present

## 2017-09-15 DIAGNOSIS — M1711 Unilateral primary osteoarthritis, right knee: Secondary | ICD-10-CM | POA: Diagnosis not present

## 2017-09-23 DIAGNOSIS — I10 Essential (primary) hypertension: Secondary | ICD-10-CM | POA: Diagnosis not present

## 2017-09-23 DIAGNOSIS — F0390 Unspecified dementia without behavioral disturbance: Secondary | ICD-10-CM | POA: Diagnosis not present

## 2017-09-23 DIAGNOSIS — M1711 Unilateral primary osteoarthritis, right knee: Secondary | ICD-10-CM | POA: Diagnosis not present

## 2017-09-24 DIAGNOSIS — F0390 Unspecified dementia without behavioral disturbance: Secondary | ICD-10-CM | POA: Diagnosis not present

## 2017-09-24 DIAGNOSIS — M1711 Unilateral primary osteoarthritis, right knee: Secondary | ICD-10-CM | POA: Diagnosis not present

## 2017-09-24 DIAGNOSIS — I10 Essential (primary) hypertension: Secondary | ICD-10-CM | POA: Diagnosis not present

## 2017-09-28 DIAGNOSIS — M1711 Unilateral primary osteoarthritis, right knee: Secondary | ICD-10-CM | POA: Diagnosis not present

## 2017-09-28 DIAGNOSIS — F0390 Unspecified dementia without behavioral disturbance: Secondary | ICD-10-CM | POA: Diagnosis not present

## 2017-09-28 DIAGNOSIS — I10 Essential (primary) hypertension: Secondary | ICD-10-CM | POA: Diagnosis not present

## 2017-09-30 DIAGNOSIS — M1711 Unilateral primary osteoarthritis, right knee: Secondary | ICD-10-CM | POA: Diagnosis not present

## 2017-09-30 DIAGNOSIS — I10 Essential (primary) hypertension: Secondary | ICD-10-CM | POA: Diagnosis not present

## 2017-09-30 DIAGNOSIS — F0390 Unspecified dementia without behavioral disturbance: Secondary | ICD-10-CM | POA: Diagnosis not present

## 2017-10-05 DIAGNOSIS — F0390 Unspecified dementia without behavioral disturbance: Secondary | ICD-10-CM | POA: Diagnosis not present

## 2017-10-05 DIAGNOSIS — M1711 Unilateral primary osteoarthritis, right knee: Secondary | ICD-10-CM | POA: Diagnosis not present

## 2017-10-05 DIAGNOSIS — D649 Anemia, unspecified: Secondary | ICD-10-CM | POA: Diagnosis not present

## 2017-10-05 DIAGNOSIS — I10 Essential (primary) hypertension: Secondary | ICD-10-CM | POA: Diagnosis not present

## 2017-10-12 DIAGNOSIS — F0390 Unspecified dementia without behavioral disturbance: Secondary | ICD-10-CM | POA: Diagnosis not present

## 2017-10-12 DIAGNOSIS — I10 Essential (primary) hypertension: Secondary | ICD-10-CM | POA: Diagnosis not present

## 2017-10-12 DIAGNOSIS — M1711 Unilateral primary osteoarthritis, right knee: Secondary | ICD-10-CM | POA: Diagnosis not present

## 2017-10-14 DIAGNOSIS — I10 Essential (primary) hypertension: Secondary | ICD-10-CM | POA: Diagnosis not present

## 2017-10-14 DIAGNOSIS — M1711 Unilateral primary osteoarthritis, right knee: Secondary | ICD-10-CM | POA: Diagnosis not present

## 2017-10-14 DIAGNOSIS — F0390 Unspecified dementia without behavioral disturbance: Secondary | ICD-10-CM | POA: Diagnosis not present

## 2017-10-19 DIAGNOSIS — I10 Essential (primary) hypertension: Secondary | ICD-10-CM | POA: Diagnosis not present

## 2017-10-19 DIAGNOSIS — M1711 Unilateral primary osteoarthritis, right knee: Secondary | ICD-10-CM | POA: Diagnosis not present

## 2017-10-19 DIAGNOSIS — F0390 Unspecified dementia without behavioral disturbance: Secondary | ICD-10-CM | POA: Diagnosis not present

## 2017-10-21 ENCOUNTER — Other Ambulatory Visit: Payer: Self-pay | Admitting: Orthopedic Surgery

## 2017-10-21 DIAGNOSIS — F0390 Unspecified dementia without behavioral disturbance: Secondary | ICD-10-CM | POA: Diagnosis not present

## 2017-10-21 DIAGNOSIS — M1711 Unilateral primary osteoarthritis, right knee: Secondary | ICD-10-CM | POA: Diagnosis not present

## 2017-10-21 DIAGNOSIS — I10 Essential (primary) hypertension: Secondary | ICD-10-CM | POA: Diagnosis not present

## 2017-10-28 DIAGNOSIS — F0390 Unspecified dementia without behavioral disturbance: Secondary | ICD-10-CM | POA: Diagnosis not present

## 2017-10-28 DIAGNOSIS — I10 Essential (primary) hypertension: Secondary | ICD-10-CM | POA: Diagnosis not present

## 2017-10-28 DIAGNOSIS — M1711 Unilateral primary osteoarthritis, right knee: Secondary | ICD-10-CM | POA: Diagnosis not present

## 2017-10-30 DIAGNOSIS — I10 Essential (primary) hypertension: Secondary | ICD-10-CM | POA: Diagnosis not present

## 2017-10-30 DIAGNOSIS — M1711 Unilateral primary osteoarthritis, right knee: Secondary | ICD-10-CM | POA: Diagnosis not present

## 2017-10-30 DIAGNOSIS — F0390 Unspecified dementia without behavioral disturbance: Secondary | ICD-10-CM | POA: Diagnosis not present

## 2017-11-04 DIAGNOSIS — F0390 Unspecified dementia without behavioral disturbance: Secondary | ICD-10-CM | POA: Diagnosis not present

## 2017-11-04 DIAGNOSIS — I10 Essential (primary) hypertension: Secondary | ICD-10-CM | POA: Diagnosis not present

## 2017-11-04 DIAGNOSIS — M1711 Unilateral primary osteoarthritis, right knee: Secondary | ICD-10-CM | POA: Diagnosis not present

## 2017-11-05 DIAGNOSIS — I10 Essential (primary) hypertension: Secondary | ICD-10-CM | POA: Diagnosis not present

## 2017-11-05 DIAGNOSIS — F0390 Unspecified dementia without behavioral disturbance: Secondary | ICD-10-CM | POA: Diagnosis not present

## 2017-11-05 DIAGNOSIS — M1711 Unilateral primary osteoarthritis, right knee: Secondary | ICD-10-CM | POA: Diagnosis not present

## 2017-11-10 DIAGNOSIS — F0390 Unspecified dementia without behavioral disturbance: Secondary | ICD-10-CM | POA: Diagnosis not present

## 2017-11-10 DIAGNOSIS — M1711 Unilateral primary osteoarthritis, right knee: Secondary | ICD-10-CM | POA: Diagnosis not present

## 2017-11-10 DIAGNOSIS — I10 Essential (primary) hypertension: Secondary | ICD-10-CM | POA: Diagnosis not present

## 2017-11-11 DIAGNOSIS — F0390 Unspecified dementia without behavioral disturbance: Secondary | ICD-10-CM | POA: Diagnosis not present

## 2017-11-11 DIAGNOSIS — I10 Essential (primary) hypertension: Secondary | ICD-10-CM | POA: Diagnosis not present

## 2017-11-11 DIAGNOSIS — M1711 Unilateral primary osteoarthritis, right knee: Secondary | ICD-10-CM | POA: Diagnosis not present

## 2017-11-17 ENCOUNTER — Ambulatory Visit: Payer: Medicare HMO | Admitting: Orthopedic Surgery

## 2017-11-17 ENCOUNTER — Encounter: Payer: Self-pay | Admitting: Orthopedic Surgery

## 2017-11-17 VITALS — BP 154/80 | HR 78 | Ht 64.0 in | Wt 134.0 lb

## 2017-11-17 DIAGNOSIS — M25562 Pain in left knee: Secondary | ICD-10-CM | POA: Diagnosis not present

## 2017-11-17 DIAGNOSIS — M25561 Pain in right knee: Secondary | ICD-10-CM | POA: Diagnosis not present

## 2017-11-17 DIAGNOSIS — G8929 Other chronic pain: Secondary | ICD-10-CM | POA: Diagnosis not present

## 2017-11-17 MED ORDER — HYDROCODONE-ACETAMINOPHEN 5-325 MG PO TABS: 1.0000 | ORAL_TABLET | Freq: Three times a day (TID) | ORAL | 0 refills | Status: DC | PRN

## 2017-11-17 NOTE — Progress Notes (Signed)
Chief Complaint  Patient presents with  . Knee Pain    bilateral      Procedure note left knee injection verbal consent was obtained to inject left knee joint  Timeout was completed to confirm the site of injection  The medications used were 40 mg of Depo-Medrol and 1% lidocaine 3 cc  Anesthesia was provided by ethyl chloride and the skin was prepped with alcohol.  After cleaning the skin with alcohol a 20-gauge needle was used to inject the left knee joint. There were no complications. A sterile bandage was applied.   Procedure note right knee injection verbal consent was obtained to inject right knee joint  Timeout was completed to confirm the site of injection  The medications used were 40 mg of Depo-Medrol and 1% lidocaine 3 cc  Anesthesia was provided by ethyl chloride and the skin was prepped with alcohol.  After cleaning the skin with alcohol a 20-gauge needle was used to inject the right knee joint. There were no complications. A sterile bandage was applied.   Risk Indicators Nar: 310 Sed: 381 Stim: 000 ORS: 120 Red Flags [ 0 ]  NARX SCORES  Narcotic  310  Sedative  381  Stimulant  000  Explanation and Guidance  OVERDOSE RISK SCORE  120

## 2017-11-22 ENCOUNTER — Telehealth: Payer: Self-pay | Admitting: Orthopedic Surgery

## 2017-11-22 NOTE — Telephone Encounter (Signed)
Did we recieve a preauthorization for pt's Hydrocodone?  Patient called with this question  Please advise

## 2017-11-22 NOTE — Telephone Encounter (Signed)
Advised her Medicare will not cover, she should try to get this without using the insurance.

## 2017-11-24 ENCOUNTER — Other Ambulatory Visit: Payer: Self-pay | Admitting: Orthopedic Surgery

## 2017-12-27 ENCOUNTER — Other Ambulatory Visit: Payer: Self-pay | Admitting: Orthopedic Surgery

## 2017-12-27 ENCOUNTER — Telehealth: Payer: Self-pay | Admitting: Orthopedic Surgery

## 2017-12-27 NOTE — Telephone Encounter (Signed)
Patient's daughter Judeen Hammans) called stating that her mom thinks that the injection she got at her last visit is different from the others she has gotten. She can hardly walk now per her daughter.   Please call and advise

## 2017-12-27 NOTE — Telephone Encounter (Signed)
I have called to advise the injections are the same, but the arthritis is likely worse, so they may not help as much. Daughter has voiced understanding, and will keep appointment as as scheduled  To you FYI

## 2017-12-29 DIAGNOSIS — K222 Esophageal obstruction: Secondary | ICD-10-CM | POA: Diagnosis not present

## 2018-01-03 DIAGNOSIS — E785 Hyperlipidemia, unspecified: Secondary | ICD-10-CM | POA: Diagnosis not present

## 2018-01-03 DIAGNOSIS — D649 Anemia, unspecified: Secondary | ICD-10-CM | POA: Diagnosis not present

## 2018-01-03 DIAGNOSIS — I10 Essential (primary) hypertension: Secondary | ICD-10-CM | POA: Diagnosis not present

## 2018-01-25 ENCOUNTER — Other Ambulatory Visit: Payer: Self-pay | Admitting: Orthopedic Surgery

## 2018-02-12 DIAGNOSIS — M150 Primary generalized (osteo)arthritis: Secondary | ICD-10-CM | POA: Diagnosis not present

## 2018-02-12 DIAGNOSIS — I10 Essential (primary) hypertension: Secondary | ICD-10-CM | POA: Diagnosis not present

## 2018-02-12 DIAGNOSIS — D649 Anemia, unspecified: Secondary | ICD-10-CM | POA: Diagnosis not present

## 2018-02-12 DIAGNOSIS — F0390 Unspecified dementia without behavioral disturbance: Secondary | ICD-10-CM | POA: Diagnosis not present

## 2018-02-12 DIAGNOSIS — E785 Hyperlipidemia, unspecified: Secondary | ICD-10-CM | POA: Diagnosis not present

## 2018-02-12 DIAGNOSIS — Z9181 History of falling: Secondary | ICD-10-CM | POA: Diagnosis not present

## 2018-02-15 DIAGNOSIS — E785 Hyperlipidemia, unspecified: Secondary | ICD-10-CM | POA: Diagnosis not present

## 2018-02-15 DIAGNOSIS — F0390 Unspecified dementia without behavioral disturbance: Secondary | ICD-10-CM | POA: Diagnosis not present

## 2018-02-15 DIAGNOSIS — D649 Anemia, unspecified: Secondary | ICD-10-CM | POA: Diagnosis not present

## 2018-02-15 DIAGNOSIS — I10 Essential (primary) hypertension: Secondary | ICD-10-CM | POA: Diagnosis not present

## 2018-02-15 DIAGNOSIS — M150 Primary generalized (osteo)arthritis: Secondary | ICD-10-CM | POA: Diagnosis not present

## 2018-02-15 DIAGNOSIS — Z9181 History of falling: Secondary | ICD-10-CM | POA: Diagnosis not present

## 2018-02-16 ENCOUNTER — Ambulatory Visit: Payer: Medicare HMO | Admitting: Orthopedic Surgery

## 2018-02-16 DIAGNOSIS — M150 Primary generalized (osteo)arthritis: Secondary | ICD-10-CM | POA: Diagnosis not present

## 2018-02-16 DIAGNOSIS — Z9181 History of falling: Secondary | ICD-10-CM | POA: Diagnosis not present

## 2018-02-16 DIAGNOSIS — E785 Hyperlipidemia, unspecified: Secondary | ICD-10-CM | POA: Diagnosis not present

## 2018-02-16 DIAGNOSIS — F0390 Unspecified dementia without behavioral disturbance: Secondary | ICD-10-CM | POA: Diagnosis not present

## 2018-02-16 DIAGNOSIS — D649 Anemia, unspecified: Secondary | ICD-10-CM | POA: Diagnosis not present

## 2018-02-16 DIAGNOSIS — I10 Essential (primary) hypertension: Secondary | ICD-10-CM | POA: Diagnosis not present

## 2018-02-18 ENCOUNTER — Encounter: Payer: Self-pay | Admitting: Orthopedic Surgery

## 2018-02-18 ENCOUNTER — Ambulatory Visit: Payer: Medicare HMO | Admitting: Orthopedic Surgery

## 2018-02-18 VITALS — BP 108/68 | HR 47 | Ht 64.0 in

## 2018-02-18 DIAGNOSIS — M150 Primary generalized (osteo)arthritis: Secondary | ICD-10-CM | POA: Diagnosis not present

## 2018-02-18 DIAGNOSIS — M25561 Pain in right knee: Secondary | ICD-10-CM | POA: Diagnosis not present

## 2018-02-18 DIAGNOSIS — I10 Essential (primary) hypertension: Secondary | ICD-10-CM | POA: Diagnosis not present

## 2018-02-18 DIAGNOSIS — Z9181 History of falling: Secondary | ICD-10-CM | POA: Diagnosis not present

## 2018-02-18 DIAGNOSIS — F0390 Unspecified dementia without behavioral disturbance: Secondary | ICD-10-CM | POA: Diagnosis not present

## 2018-02-18 DIAGNOSIS — M25562 Pain in left knee: Secondary | ICD-10-CM | POA: Diagnosis not present

## 2018-02-18 DIAGNOSIS — D649 Anemia, unspecified: Secondary | ICD-10-CM | POA: Diagnosis not present

## 2018-02-18 DIAGNOSIS — E785 Hyperlipidemia, unspecified: Secondary | ICD-10-CM | POA: Diagnosis not present

## 2018-02-18 MED ORDER — HYDROCODONE-ACETAMINOPHEN 5-325 MG PO TABS: 1.0000 | ORAL_TABLET | Freq: Three times a day (TID) | ORAL | 0 refills | Status: DC | PRN

## 2018-02-18 NOTE — Progress Notes (Signed)
No chief complaint on file.  Procedure note left knee injection verbal consent was obtained to inject left knee joint  Timeout was completed to confirm the site of injection  The medications used were 40 mg of Depo-Medrol and 1% lidocaine 3 cc  Anesthesia was provided by ethyl chloride and the skin was prepped with alcohol.  After cleaning the skin with alcohol a 20-gauge needle was used to inject the left knee joint. There were no complications. A sterile bandage was applied.   Procedure note right knee injection verbal consent was obtained to inject right knee joint  Timeout was completed to confirm the site of injection  The medications used were 40 mg of Depo-Medrol and 1% lidocaine 3 cc  Anesthesia was provided by ethyl chloride and the skin was prepped with alcohol.  After cleaning the skin with alcohol a 20-gauge needle was used to inject the right knee joint. There were no complications. A sterile bandage was applied.   Encounter Diagnosis  Name Primary?  Marland Kitchen Arthralgia of both lower legs Yes    Fu 2 months

## 2018-02-22 DIAGNOSIS — F0390 Unspecified dementia without behavioral disturbance: Secondary | ICD-10-CM | POA: Diagnosis not present

## 2018-02-22 DIAGNOSIS — E785 Hyperlipidemia, unspecified: Secondary | ICD-10-CM | POA: Diagnosis not present

## 2018-02-22 DIAGNOSIS — I10 Essential (primary) hypertension: Secondary | ICD-10-CM | POA: Diagnosis not present

## 2018-02-22 DIAGNOSIS — M150 Primary generalized (osteo)arthritis: Secondary | ICD-10-CM | POA: Diagnosis not present

## 2018-02-22 DIAGNOSIS — Z9181 History of falling: Secondary | ICD-10-CM | POA: Diagnosis not present

## 2018-02-22 DIAGNOSIS — D649 Anemia, unspecified: Secondary | ICD-10-CM | POA: Diagnosis not present

## 2018-02-23 DIAGNOSIS — I10 Essential (primary) hypertension: Secondary | ICD-10-CM | POA: Diagnosis not present

## 2018-02-23 DIAGNOSIS — F0390 Unspecified dementia without behavioral disturbance: Secondary | ICD-10-CM | POA: Diagnosis not present

## 2018-02-23 DIAGNOSIS — E785 Hyperlipidemia, unspecified: Secondary | ICD-10-CM | POA: Diagnosis not present

## 2018-02-23 DIAGNOSIS — M150 Primary generalized (osteo)arthritis: Secondary | ICD-10-CM | POA: Diagnosis not present

## 2018-02-23 DIAGNOSIS — D649 Anemia, unspecified: Secondary | ICD-10-CM | POA: Diagnosis not present

## 2018-02-23 DIAGNOSIS — Z9181 History of falling: Secondary | ICD-10-CM | POA: Diagnosis not present

## 2018-02-25 DIAGNOSIS — Z9181 History of falling: Secondary | ICD-10-CM | POA: Diagnosis not present

## 2018-02-25 DIAGNOSIS — E785 Hyperlipidemia, unspecified: Secondary | ICD-10-CM | POA: Diagnosis not present

## 2018-02-25 DIAGNOSIS — M150 Primary generalized (osteo)arthritis: Secondary | ICD-10-CM | POA: Diagnosis not present

## 2018-02-25 DIAGNOSIS — F0390 Unspecified dementia without behavioral disturbance: Secondary | ICD-10-CM | POA: Diagnosis not present

## 2018-02-25 DIAGNOSIS — D649 Anemia, unspecified: Secondary | ICD-10-CM | POA: Diagnosis not present

## 2018-02-25 DIAGNOSIS — I10 Essential (primary) hypertension: Secondary | ICD-10-CM | POA: Diagnosis not present

## 2018-02-28 ENCOUNTER — Other Ambulatory Visit: Payer: Self-pay | Admitting: Orthopedic Surgery

## 2018-03-02 DIAGNOSIS — E785 Hyperlipidemia, unspecified: Secondary | ICD-10-CM | POA: Diagnosis not present

## 2018-03-02 DIAGNOSIS — D649 Anemia, unspecified: Secondary | ICD-10-CM | POA: Diagnosis not present

## 2018-03-02 DIAGNOSIS — F0390 Unspecified dementia without behavioral disturbance: Secondary | ICD-10-CM | POA: Diagnosis not present

## 2018-03-02 DIAGNOSIS — M150 Primary generalized (osteo)arthritis: Secondary | ICD-10-CM | POA: Diagnosis not present

## 2018-03-02 DIAGNOSIS — Z9181 History of falling: Secondary | ICD-10-CM | POA: Diagnosis not present

## 2018-03-02 DIAGNOSIS — I10 Essential (primary) hypertension: Secondary | ICD-10-CM | POA: Diagnosis not present

## 2018-03-03 DIAGNOSIS — D649 Anemia, unspecified: Secondary | ICD-10-CM | POA: Diagnosis not present

## 2018-03-03 DIAGNOSIS — F0390 Unspecified dementia without behavioral disturbance: Secondary | ICD-10-CM | POA: Diagnosis not present

## 2018-03-03 DIAGNOSIS — I10 Essential (primary) hypertension: Secondary | ICD-10-CM | POA: Diagnosis not present

## 2018-03-03 DIAGNOSIS — M150 Primary generalized (osteo)arthritis: Secondary | ICD-10-CM | POA: Diagnosis not present

## 2018-03-03 DIAGNOSIS — E785 Hyperlipidemia, unspecified: Secondary | ICD-10-CM | POA: Diagnosis not present

## 2018-03-03 DIAGNOSIS — Z9181 History of falling: Secondary | ICD-10-CM | POA: Diagnosis not present

## 2018-03-04 DIAGNOSIS — I10 Essential (primary) hypertension: Secondary | ICD-10-CM | POA: Diagnosis not present

## 2018-03-04 DIAGNOSIS — F0390 Unspecified dementia without behavioral disturbance: Secondary | ICD-10-CM | POA: Diagnosis not present

## 2018-03-04 DIAGNOSIS — M150 Primary generalized (osteo)arthritis: Secondary | ICD-10-CM | POA: Diagnosis not present

## 2018-03-04 DIAGNOSIS — Z9181 History of falling: Secondary | ICD-10-CM | POA: Diagnosis not present

## 2018-03-04 DIAGNOSIS — D649 Anemia, unspecified: Secondary | ICD-10-CM | POA: Diagnosis not present

## 2018-03-04 DIAGNOSIS — E785 Hyperlipidemia, unspecified: Secondary | ICD-10-CM | POA: Diagnosis not present

## 2018-03-07 DIAGNOSIS — F0390 Unspecified dementia without behavioral disturbance: Secondary | ICD-10-CM | POA: Diagnosis not present

## 2018-03-07 DIAGNOSIS — I10 Essential (primary) hypertension: Secondary | ICD-10-CM | POA: Diagnosis not present

## 2018-03-07 DIAGNOSIS — Z9181 History of falling: Secondary | ICD-10-CM | POA: Diagnosis not present

## 2018-03-07 DIAGNOSIS — M150 Primary generalized (osteo)arthritis: Secondary | ICD-10-CM | POA: Diagnosis not present

## 2018-03-07 DIAGNOSIS — E785 Hyperlipidemia, unspecified: Secondary | ICD-10-CM | POA: Diagnosis not present

## 2018-03-07 DIAGNOSIS — D649 Anemia, unspecified: Secondary | ICD-10-CM | POA: Diagnosis not present

## 2018-03-10 DIAGNOSIS — F0390 Unspecified dementia without behavioral disturbance: Secondary | ICD-10-CM | POA: Diagnosis not present

## 2018-03-10 DIAGNOSIS — I10 Essential (primary) hypertension: Secondary | ICD-10-CM | POA: Diagnosis not present

## 2018-03-10 DIAGNOSIS — Z9181 History of falling: Secondary | ICD-10-CM | POA: Diagnosis not present

## 2018-03-10 DIAGNOSIS — M150 Primary generalized (osteo)arthritis: Secondary | ICD-10-CM | POA: Diagnosis not present

## 2018-03-10 DIAGNOSIS — D649 Anemia, unspecified: Secondary | ICD-10-CM | POA: Diagnosis not present

## 2018-03-10 DIAGNOSIS — E785 Hyperlipidemia, unspecified: Secondary | ICD-10-CM | POA: Diagnosis not present

## 2018-03-11 DIAGNOSIS — F0390 Unspecified dementia without behavioral disturbance: Secondary | ICD-10-CM | POA: Diagnosis not present

## 2018-03-11 DIAGNOSIS — I10 Essential (primary) hypertension: Secondary | ICD-10-CM | POA: Diagnosis not present

## 2018-03-11 DIAGNOSIS — E785 Hyperlipidemia, unspecified: Secondary | ICD-10-CM | POA: Diagnosis not present

## 2018-03-11 DIAGNOSIS — D649 Anemia, unspecified: Secondary | ICD-10-CM | POA: Diagnosis not present

## 2018-03-11 DIAGNOSIS — M150 Primary generalized (osteo)arthritis: Secondary | ICD-10-CM | POA: Diagnosis not present

## 2018-03-11 DIAGNOSIS — Z9181 History of falling: Secondary | ICD-10-CM | POA: Diagnosis not present

## 2018-03-15 DIAGNOSIS — M150 Primary generalized (osteo)arthritis: Secondary | ICD-10-CM | POA: Diagnosis not present

## 2018-03-15 DIAGNOSIS — D649 Anemia, unspecified: Secondary | ICD-10-CM | POA: Diagnosis not present

## 2018-03-15 DIAGNOSIS — I10 Essential (primary) hypertension: Secondary | ICD-10-CM | POA: Diagnosis not present

## 2018-03-15 DIAGNOSIS — F0390 Unspecified dementia without behavioral disturbance: Secondary | ICD-10-CM | POA: Diagnosis not present

## 2018-03-15 DIAGNOSIS — E785 Hyperlipidemia, unspecified: Secondary | ICD-10-CM | POA: Diagnosis not present

## 2018-03-15 DIAGNOSIS — Z9181 History of falling: Secondary | ICD-10-CM | POA: Diagnosis not present

## 2018-03-17 DIAGNOSIS — I10 Essential (primary) hypertension: Secondary | ICD-10-CM | POA: Diagnosis not present

## 2018-03-17 DIAGNOSIS — M150 Primary generalized (osteo)arthritis: Secondary | ICD-10-CM | POA: Diagnosis not present

## 2018-03-17 DIAGNOSIS — F0390 Unspecified dementia without behavioral disturbance: Secondary | ICD-10-CM | POA: Diagnosis not present

## 2018-03-17 DIAGNOSIS — Z9181 History of falling: Secondary | ICD-10-CM | POA: Diagnosis not present

## 2018-03-17 DIAGNOSIS — D649 Anemia, unspecified: Secondary | ICD-10-CM | POA: Diagnosis not present

## 2018-03-17 DIAGNOSIS — E785 Hyperlipidemia, unspecified: Secondary | ICD-10-CM | POA: Diagnosis not present

## 2018-03-18 DIAGNOSIS — I10 Essential (primary) hypertension: Secondary | ICD-10-CM | POA: Diagnosis not present

## 2018-03-18 DIAGNOSIS — D649 Anemia, unspecified: Secondary | ICD-10-CM | POA: Diagnosis not present

## 2018-03-18 DIAGNOSIS — E785 Hyperlipidemia, unspecified: Secondary | ICD-10-CM | POA: Diagnosis not present

## 2018-03-18 DIAGNOSIS — Z9181 History of falling: Secondary | ICD-10-CM | POA: Diagnosis not present

## 2018-03-18 DIAGNOSIS — M150 Primary generalized (osteo)arthritis: Secondary | ICD-10-CM | POA: Diagnosis not present

## 2018-03-18 DIAGNOSIS — F0390 Unspecified dementia without behavioral disturbance: Secondary | ICD-10-CM | POA: Diagnosis not present

## 2018-03-22 DIAGNOSIS — Z9181 History of falling: Secondary | ICD-10-CM | POA: Diagnosis not present

## 2018-03-22 DIAGNOSIS — D649 Anemia, unspecified: Secondary | ICD-10-CM | POA: Diagnosis not present

## 2018-03-22 DIAGNOSIS — M150 Primary generalized (osteo)arthritis: Secondary | ICD-10-CM | POA: Diagnosis not present

## 2018-03-22 DIAGNOSIS — F0390 Unspecified dementia without behavioral disturbance: Secondary | ICD-10-CM | POA: Diagnosis not present

## 2018-03-22 DIAGNOSIS — I10 Essential (primary) hypertension: Secondary | ICD-10-CM | POA: Diagnosis not present

## 2018-03-22 DIAGNOSIS — E785 Hyperlipidemia, unspecified: Secondary | ICD-10-CM | POA: Diagnosis not present

## 2018-03-23 DIAGNOSIS — D649 Anemia, unspecified: Secondary | ICD-10-CM | POA: Diagnosis not present

## 2018-03-23 DIAGNOSIS — F0390 Unspecified dementia without behavioral disturbance: Secondary | ICD-10-CM | POA: Diagnosis not present

## 2018-03-23 DIAGNOSIS — Z9181 History of falling: Secondary | ICD-10-CM | POA: Diagnosis not present

## 2018-03-23 DIAGNOSIS — E785 Hyperlipidemia, unspecified: Secondary | ICD-10-CM | POA: Diagnosis not present

## 2018-03-23 DIAGNOSIS — I10 Essential (primary) hypertension: Secondary | ICD-10-CM | POA: Diagnosis not present

## 2018-03-23 DIAGNOSIS — M150 Primary generalized (osteo)arthritis: Secondary | ICD-10-CM | POA: Diagnosis not present

## 2018-03-24 DIAGNOSIS — F0390 Unspecified dementia without behavioral disturbance: Secondary | ICD-10-CM | POA: Diagnosis not present

## 2018-03-24 DIAGNOSIS — I10 Essential (primary) hypertension: Secondary | ICD-10-CM | POA: Diagnosis not present

## 2018-03-24 DIAGNOSIS — M150 Primary generalized (osteo)arthritis: Secondary | ICD-10-CM | POA: Diagnosis not present

## 2018-03-24 DIAGNOSIS — E785 Hyperlipidemia, unspecified: Secondary | ICD-10-CM | POA: Diagnosis not present

## 2018-03-24 DIAGNOSIS — Z9181 History of falling: Secondary | ICD-10-CM | POA: Diagnosis not present

## 2018-03-24 DIAGNOSIS — D649 Anemia, unspecified: Secondary | ICD-10-CM | POA: Diagnosis not present

## 2018-03-29 ENCOUNTER — Other Ambulatory Visit: Payer: Self-pay | Admitting: Orthopedic Surgery

## 2018-03-31 DIAGNOSIS — D649 Anemia, unspecified: Secondary | ICD-10-CM | POA: Diagnosis not present

## 2018-03-31 DIAGNOSIS — F0390 Unspecified dementia without behavioral disturbance: Secondary | ICD-10-CM | POA: Diagnosis not present

## 2018-03-31 DIAGNOSIS — M150 Primary generalized (osteo)arthritis: Secondary | ICD-10-CM | POA: Diagnosis not present

## 2018-03-31 DIAGNOSIS — I10 Essential (primary) hypertension: Secondary | ICD-10-CM | POA: Diagnosis not present

## 2018-03-31 DIAGNOSIS — Z9181 History of falling: Secondary | ICD-10-CM | POA: Diagnosis not present

## 2018-03-31 DIAGNOSIS — E785 Hyperlipidemia, unspecified: Secondary | ICD-10-CM | POA: Diagnosis not present

## 2018-04-04 ENCOUNTER — Encounter (HOSPITAL_COMMUNITY): Payer: Self-pay | Admitting: *Deleted

## 2018-04-04 ENCOUNTER — Emergency Department (HOSPITAL_COMMUNITY)
Admission: EM | Admit: 2018-04-04 | Discharge: 2018-04-04 | Disposition: A | Discharge status: Home / Self Care | Payer: Medicare HMO | Attending: Emergency Medicine | Admitting: Emergency Medicine

## 2018-04-04 ENCOUNTER — Other Ambulatory Visit: Payer: Self-pay

## 2018-04-04 ENCOUNTER — Emergency Department (HOSPITAL_COMMUNITY): Payer: Medicare HMO

## 2018-04-04 DIAGNOSIS — R000 Tachycardia, unspecified: Secondary | ICD-10-CM | POA: Diagnosis not present

## 2018-04-04 DIAGNOSIS — Z79899 Other long term (current) drug therapy: Secondary | ICD-10-CM | POA: Diagnosis not present

## 2018-04-04 DIAGNOSIS — R1310 Dysphagia, unspecified: Secondary | ICD-10-CM | POA: Insufficient documentation

## 2018-04-04 DIAGNOSIS — R1312 Dysphagia, oropharyngeal phase: Secondary | ICD-10-CM | POA: Diagnosis not present

## 2018-04-04 DIAGNOSIS — R0682 Tachypnea, not elsewhere classified: Secondary | ICD-10-CM | POA: Diagnosis not present

## 2018-04-04 DIAGNOSIS — J9811 Atelectasis: Secondary | ICD-10-CM | POA: Diagnosis not present

## 2018-04-04 DIAGNOSIS — I10 Essential (primary) hypertension: Secondary | ICD-10-CM | POA: Diagnosis not present

## 2018-04-04 DIAGNOSIS — R0602 Shortness of breath: Secondary | ICD-10-CM | POA: Diagnosis not present

## 2018-04-04 DIAGNOSIS — J029 Acute pharyngitis, unspecified: Secondary | ICD-10-CM | POA: Diagnosis present

## 2018-04-04 LAB — CBC WITH DIFFERENTIAL/PLATELET
BASOS ABS: 0 10*3/uL (ref 0.0–0.1)
BASOS PCT: 1 %
EOS ABS: 0.1 10*3/uL (ref 0.0–0.7)
Eosinophils Relative: 2 %
HCT: 32.3 % — ABNORMAL LOW (ref 36.0–46.0)
Hemoglobin: 10.7 g/dL — ABNORMAL LOW (ref 12.0–15.0)
Lymphocytes Relative: 20 %
Lymphs Abs: 1.2 10*3/uL (ref 0.7–4.0)
MCH: 30.4 pg (ref 26.0–34.0)
MCHC: 33.1 g/dL (ref 30.0–36.0)
MCV: 91.8 fL (ref 78.0–100.0)
MONOS PCT: 11 %
Monocytes Absolute: 0.7 10*3/uL (ref 0.1–1.0)
NEUTROS PCT: 68 %
Neutro Abs: 4.2 10*3/uL (ref 1.7–7.7)
PLATELETS: 216 10*3/uL (ref 150–400)
RBC: 3.52 MIL/uL — AB (ref 3.87–5.11)
RDW: 15.6 % — ABNORMAL HIGH (ref 11.5–15.5)
WBC: 6.2 10*3/uL (ref 4.0–10.5)

## 2018-04-04 LAB — COMPREHENSIVE METABOLIC PANEL
ALBUMIN: 3.7 g/dL (ref 3.5–5.0)
ALT: 11 U/L — AB (ref 14–54)
AST: 21 U/L (ref 15–41)
Alkaline Phosphatase: 91 U/L (ref 38–126)
Anion gap: 11 (ref 5–15)
BUN: 12 mg/dL (ref 6–20)
CHLORIDE: 96 mmol/L — AB (ref 101–111)
CO2: 23 mmol/L (ref 22–32)
Calcium: 8.9 mg/dL (ref 8.9–10.3)
Creatinine, Ser: 0.77 mg/dL (ref 0.44–1.00)
GFR calc Af Amer: >60 mL/min (ref >60)
GFR calc non Af Amer: >60 mL/min (ref >60)
GLUCOSE: 129 mg/dL — AB (ref 65–99)
POTASSIUM: 3.1 mmol/L — AB (ref 3.5–5.1)
SODIUM: 130 mmol/L — AB (ref 135–145)
Total Bilirubin: 0.6 mg/dL (ref 0.3–1.2)
Total Protein: 7.5 g/dL (ref 6.5–8.1)

## 2018-04-04 LAB — LIPASE, BLOOD: Lipase: 28 U/L (ref 11–51)

## 2018-04-04 LAB — TROPONIN I
Troponin I: <0.03 ng/mL (ref <0.03)
Troponin I: <0.03 ng/mL (ref <0.03)

## 2018-04-04 MED ORDER — GI COCKTAIL ~~LOC~~
30.0000 mL | Freq: Once | ORAL | Status: AC
  Administered 2018-04-04: 30 mL via ORAL
  Filled 2018-04-04: qty 30

## 2018-04-04 MED ORDER — SODIUM CHLORIDE 0.9 % IV BOLUS: 500.0000 mL | Freq: Once | INTRAVENOUS | Status: DC

## 2018-04-04 MED ORDER — POTASSIUM CHLORIDE 20 MEQ PO PACK
40.0000 meq | PACK | Freq: Once | ORAL | Status: AC
  Administered 2018-04-04: 40 meq via ORAL
  Filled 2018-04-04: qty 2

## 2018-04-04 MED ORDER — GLUCAGON HCL RDNA (DIAGNOSTIC) 1 MG IJ SOLR
1.0000 mg | Freq: Once | INTRAMUSCULAR | Status: AC
  Administered 2018-04-04: 1 mg via INTRAVENOUS
  Filled 2018-04-04: qty 1

## 2018-04-04 MED ORDER — ONDANSETRON HCL 4 MG/2ML IJ SOLN
4.0000 mg | Freq: Once | INTRAMUSCULAR | Status: AC
  Administered 2018-04-04: 4 mg via INTRAVENOUS
  Filled 2018-04-04: qty 2

## 2018-04-04 NOTE — Discharge Instructions (Signed)
Continue your stomach medication omeprazole.  Follow-up with Dr. Penelope Coop.  Return to the ED if you develop chest pain, shortness of breath, any other concerns or difficulty swallowing.  vascular

## 2018-04-04 NOTE — ED Provider Notes (Signed)
[facility] EMERGENCY DEPARTMENT Provider Note   CSN: [id number] Arrival date & time: 04/04/18  0118     History   Chief Complaint Chief Complaint  Patient presents with  . Sore Throat    HPI [name] is a 84 y.o. female.  Patient arrives complaining of sore throat and shortness of breath.  Family states patient was doing well all yesterday and ate dinner around 5:30 PM.  Afterwards she had some "belching" and was spitting up some gas after drinking Pepsi.  She did not vomit any food.  She did not have any difficulty swallowing while she was eating dinner.  She did not have any choking episodes.  Patient has had a history of esophageal stretching in 2017.  She does also have a history of ulcers and reflux.  She complains of some shortness of breath but no chest pain.  Son at bedside reports she did not vomit any food particles but just was spitting up phlegm.  Reports she has not had any issues with swallowing recently. History limited by dementia. Level 5 caveat.  The history is provided by the patient and a relative. The history is limited by the condition of the patient.  Sore Throat     Past Medical History:  Diagnosis Date  . Anxiety   . Dementia, senile, with, delirium 10/06/2011  . Depression   . Diverticulosis    Colon   . Hepatic hemangioma   . Hx pulmonary embolism   . Hyperglycemia 10/06/2011  . Hyperlipidemia   . Hypertension   . Normocytic anemia   . Osteopenia 10/08/2003   Dx   . Polypharmacy     Patient Active Problem List   Diagnosis Date Noted  . Dementia, senile, with, delirium 10/06/2011  . Anemia 10/06/2011  . Hyperglycemia 10/06/2011  . Gait abnormality 10/05/2011  . Dehydration 10/05/2011  . Hypokalemia 10/05/2011  . Osteoarthritis 10/05/2011  . Dementia 10/05/2011  . ARF (acute renal failure) ([place]) 10/05/2011  . Knee pain 09/10/2011  . OA (osteoarthritis) of knee 09/10/2011  . KNEE, ARTHRITIS, DEGEN./OSTEO 09/11/2009  . KNEE PAIN  01/21/2009  . FIBROMYALGIA/FIBROMYOSITIS 12/10/2008  . FRACTURE, RADIUS, DISTAL 12/10/2008  . WRIST PAIN 09/06/2007  . COLLES' FRACTURE, RIGHT 08/24/2007  . HEMANGIOMA, HEPATIC 09/23/2006  . HYPERLIPIDEMIA 09/23/2006  . ANEMIA, NORMOCYTIC 09/23/2006  . DEPRESSION 09/23/2006  . HYPERTENSION 09/23/2006  . DIVERTICULOSIS, COLON 09/23/2006  . OSTEOPENIA 09/23/2006  . PULMONARY EMBOLISM, HX OF 09/23/2006    Past Surgical History:  Procedure Laterality Date  . ABDOMINAL HYSTERECTOMY    . BALLOON DILATION N/A 04/21/2016   Procedure: BALLOON DILATION;  Surgeon: Wonda Horner, MD;  Location: [facility];  Service: Endoscopy;  Laterality: N/A;  . ESOPHAGOGASTRODUODENOSCOPY (EGD) WITH PROPOFOL N/A 04/21/2016   Procedure: ESOPHAGOGASTRODUODENOSCOPY (EGD) WITH PROPOFOL;  Surgeon: Wonda Horner, MD;  Location: [facility]COPY;  Service: Endoscopy;  Laterality: N/A;  . fibroid tumor on shoulder    . Rt arm /shoulder surgery    . Rt Closed Reduction Percutaneous Pins Ext Fixator  08/26/2007   Dr. Aline Brochure  . vaginal  delivery     x10     OB History   None      Home Medications    Prior to Admission medications   Medication Sig Start Date End Date Taking? Authorizing Provider  ALPRAZolam (XANAX) 0.5 MG tablet TAKE (1) TABLET BY MOUTH AT BEDTIME. 03/30/18   Carole Civil, MD  hydrochlorothiazide (HYDRODIURIL) 25 MG tablet  02/14/18  [provider]  HYDROcodone-acetaminophen (NORCO) 5-325 MG tablet Take 1 tablet by mouth every 8 (eight) hours as needed for moderate pain. Deliver if possible 02/18/18   Carole Civil, MD  memantine (NAMENDA) 5 MG tablet Take 5 mg by mouth 2 (two) times daily.    [provider]  Menthol (ICY HOT) 5 % PTCH Place 1 patch onto the skin every 8 (eight) hours as needed (For pain.).    [provider]  metoprolol succinate (TOPROL-XL) 50 MG 24 hr tablet Take 50 mg by mouth daily. 04/01/16   [provider]  omeprazole  (PRILOSEC) 20 MG capsule  01/19/18   [provider]    Family History Family History  Problem Relation Age of Onset  . Diabetes Unknown        family history   . Cancer Unknown        family history   . Diabetes Father   . Cancer Sister        Breast  . Cancer Brother        Lung    Social History Social History   Tobacco Use  . Smoking status: Never Smoker  . Smokeless tobacco: Never Used  Substance Use Topics  . Alcohol use: Yes    Comment: wine rarely  . Drug use: No     Allergies   Erythromycin   Review of Systems Review of Systems  Unable to perform ROS: Dementia     Physical Exam Updated Vital Signs BP (!) 159/81   Pulse (!) 103   Temp 97.7 F (36.5 C) (Oral)   Resp 20   Ht 5\' 7"  (1.702 m)   Wt 60.8 kg (134 lb)   SpO2 100%   BMI 20.99 kg/m   Physical Exam  Constitutional: She is oriented to person, place, and time. She appears well-developed and well-nourished. No distress.  Appears anxious, tachypneic, controlling secretions  HENT:  Head: Normocephalic and atraumatic.  Mouth/Throat: Oropharynx is clear and moist. No oropharyngeal exudate.  Oropharynx is clear with tongue midline, no uvula swelling no foreign bodies visualized  Eyes: Pupils are equal, round, and reactive to light. Conjunctivae and EOM are normal.  Neck: Normal range of motion. Neck supple.  No meningismus.  Cardiovascular: Normal rate, normal heart sounds and intact distal pulses.  No murmur heard. Mild tachycardia  Pulmonary/Chest: Effort normal and breath sounds normal. No respiratory distress.  Abdominal: Soft. There is no tenderness. There is no rebound and no guarding.  Musculoskeletal: Normal range of motion. She exhibits no edema or tenderness.  Neurological: She is alert and oriented to person, place, and time. No cranial nerve deficit. She exhibits normal muscle tone. Coordination normal.  No ataxia on finger to nose bilaterally. No pronator drift. 5/5  strength throughout. CN 2-12 intact.Equal grip strength. Sensation intact.   Skin: Skin is warm.  Psychiatric: She has a normal mood and affect. Her behavior is normal.  Nursing note and vitals reviewed.    ED Treatments / Results  Labs (all labs ordered are listed, but only abnormal results are displayed) Labs Reviewed  CBC WITH DIFFERENTIAL/PLATELET - Abnormal; Notable for the following components:      Result Value   RBC 3.52 (*)    Hemoglobin 10.7 (*)    HCT 32.3 (*)    RDW 15.6 (*)    All other components within normal limits  COMPREHENSIVE METABOLIC PANEL - Abnormal; Notable for the following components:   Sodium 130 (*)  Potassium 3.1 (*)    Chloride 96 (*)    Glucose, Bld 129 (*)    ALT 11 (*)    All other components within normal limits  LIPASE, BLOOD  TROPONIN I  TROPONIN I    EKG EKG Interpretation  Date/Time:  Monday Apr 04 2018 02:10:39 EDT Ventricular Rate:  98 PR Interval:    QRS Duration: 91 QT Interval:  356 QTC Calculation: 455 R Axis:   -56 Text Interpretation:  Artifact suspect sinus Inferior infarct, old Anterior infarct, old Artifact in lead(s) I II III aVR aVL aVF V1 V2 Confirmed by Ezequiel Essex [phone]) on 04/04/2018 2:47:31 AM   Radiology Dg Neck Soft Tissue  Result Date: 04/04/2018 CLINICAL DATA:  Dysphagia. EXAM: NECK SOFT TISSUES - 1+ VIEW COMPARISON:  None. FINDINGS: There is no evidence of retropharyngeal soft tissue swelling or epiglottic enlargement. The cervical airway is unremarkable. No radio-opaque foreign body identified. Prominent degenerative change in the cervical spine with anterior osteophytes. IMPRESSION: No acute findings. Degenerative change in the cervical spine with anterior spurring, which can occasionally contribute to dysphagia, however did not appear contributory on 03/25/2016 esophagram. Electronically Signed   By: Jeb Levering M.D.   On: 04/04/2018 06:15   Dg Chest 2 View  Result Date: 04/04/2018 CLINICAL  DATA:  Dysphagia EXAM: CHEST - 2 VIEW COMPARISON:  10/05/2011 FINDINGS: Hyperinflation. Tiny pleural effusions or pleural thickening. No focal consolidation. Streaky atelectasis or scar at the left base. Stable cardiomediastinal silhouette. No pneumothorax. IMPRESSION: Streaky atelectasis or scar at the left base with tiny pleural effusion versus pleural thickening. Electronically Signed   By: Donavan Foil M.D.   On: 04/04/2018 03:15    Procedures Procedures (including critical care time)  Medications Ordered in ED Medications  gi cocktail (Maalox,Lidocaine,Donnatal) (30 mLs Oral Given 04/04/18 0220)  ondansetron (ZOFRAN) injection 4 mg (4 mg Intravenous Given 04/04/18 0220)  glucagon (human recombinant) (GLUCAGEN) injection 1 mg (1 mg Intravenous Given 04/04/18 0220)     Initial Impression / Assessment and Plan / ED Course  I have reviewed the triage vital signs and the nursing notes.  Pertinent labs & imaging results that were available during my care of the patient were reviewed by me and considered in my medical decision making (see chart for details).    Sore throat after "belching and spitting up". Appears uncomfortable but no distress. EKG nonischemic.   Consider food impaction given history of esophageal dilation. Patient controlling secretions and maintaining airway.  Empiric glucagon and GI cocktail given.Xrays negative for foreign body.  Patient reports improvement after above measures. No CP or SOB. Sore throat improving and able to swallow without difficulty.  Low suspicion for ACS. Troponin negative x2. HR has improved.   Suspect presentation due to dysphagia and possible food impaction, now resolved. Patient now smiling and talking with family. She has tolerated liquids and apple sauce as well as PO potassium. Advise followup with GI doctor this week. Continue PPI. Return precautions discussed.  Final Clinical Impressions(s) / ED Diagnoses   Final diagnoses:    Dysphagia, unspecified type    ED Discharge Orders    None       Lachele Lievanos, Annie Main, MD 04/04/18 1645

## 2018-04-04 NOTE — ED Triage Notes (Signed)
Pt brought in by [facility] for c/o sore throat; pt ate yesterday evening around 1730 and vomited later this evening; pt now c/o sore throat

## 2018-04-05 DIAGNOSIS — R1310 Dysphagia, unspecified: Secondary | ICD-10-CM | POA: Diagnosis not present

## 2018-04-06 DIAGNOSIS — E785 Hyperlipidemia, unspecified: Secondary | ICD-10-CM | POA: Diagnosis not present

## 2018-04-06 DIAGNOSIS — D649 Anemia, unspecified: Secondary | ICD-10-CM | POA: Diagnosis not present

## 2018-04-06 DIAGNOSIS — Z9181 History of falling: Secondary | ICD-10-CM | POA: Diagnosis not present

## 2018-04-06 DIAGNOSIS — I10 Essential (primary) hypertension: Secondary | ICD-10-CM | POA: Diagnosis not present

## 2018-04-06 DIAGNOSIS — M150 Primary generalized (osteo)arthritis: Secondary | ICD-10-CM | POA: Diagnosis not present

## 2018-04-06 DIAGNOSIS — F0390 Unspecified dementia without behavioral disturbance: Secondary | ICD-10-CM | POA: Diagnosis not present

## 2018-04-08 DIAGNOSIS — Z9181 History of falling: Secondary | ICD-10-CM | POA: Diagnosis not present

## 2018-04-08 DIAGNOSIS — D649 Anemia, unspecified: Secondary | ICD-10-CM | POA: Diagnosis not present

## 2018-04-08 DIAGNOSIS — E785 Hyperlipidemia, unspecified: Secondary | ICD-10-CM | POA: Diagnosis not present

## 2018-04-08 DIAGNOSIS — M150 Primary generalized (osteo)arthritis: Secondary | ICD-10-CM | POA: Diagnosis not present

## 2018-04-08 DIAGNOSIS — F0390 Unspecified dementia without behavioral disturbance: Secondary | ICD-10-CM | POA: Diagnosis not present

## 2018-04-08 DIAGNOSIS — I10 Essential (primary) hypertension: Secondary | ICD-10-CM | POA: Diagnosis not present

## 2018-04-12 DIAGNOSIS — E785 Hyperlipidemia, unspecified: Secondary | ICD-10-CM | POA: Diagnosis not present

## 2018-04-12 DIAGNOSIS — M130 Polyarthritis, unspecified: Secondary | ICD-10-CM | POA: Diagnosis not present

## 2018-04-12 DIAGNOSIS — I10 Essential (primary) hypertension: Secondary | ICD-10-CM | POA: Diagnosis not present

## 2018-04-12 DIAGNOSIS — D649 Anemia, unspecified: Secondary | ICD-10-CM | POA: Diagnosis not present

## 2018-04-20 ENCOUNTER — Encounter: Payer: Self-pay | Admitting: Orthopedic Surgery

## 2018-04-20 ENCOUNTER — Ambulatory Visit: Payer: Medicare HMO | Admitting: Orthopedic Surgery

## 2018-04-20 VITALS — BP 140/83 | HR 83 | Ht 67.0 in | Wt 130.0 lb

## 2018-04-20 DIAGNOSIS — M170 Bilateral primary osteoarthritis of knee: Secondary | ICD-10-CM

## 2018-04-20 DIAGNOSIS — G8929 Other chronic pain: Secondary | ICD-10-CM

## 2018-04-20 DIAGNOSIS — M25562 Pain in left knee: Secondary | ICD-10-CM

## 2018-04-20 DIAGNOSIS — M25561 Pain in right knee: Secondary | ICD-10-CM

## 2018-04-20 MED ORDER — HYDROCODONE-ACETAMINOPHEN 5-325 MG PO TABS: 1.0000 | ORAL_TABLET | Freq: Three times a day (TID) | ORAL | 0 refills | Status: DC | PRN

## 2018-04-20 NOTE — Progress Notes (Signed)
Chief Complaint  Patient presents with  . Follow-up    Bilateral knee injections     Procedure note left knee injection verbal consent was obtained to inject left knee joint  Timeout was completed to confirm the site of injection  The medications used were 40 mg of Depo-Medrol and 1% lidocaine 3 cc  Anesthesia was provided by ethyl chloride and the skin was prepped with alcohol.  After cleaning the skin with alcohol a 20-gauge needle was used to inject the left knee joint. There were no complications. A sterile bandage was applied.   Procedure note right knee injection verbal consent was obtained to inject right knee joint  Timeout was completed to confirm the site of injection  The medications used were 40 mg of Depo-Medrol and 1% lidocaine 3 cc  Anesthesia was provided by ethyl chloride and the skin was prepped with alcohol.  After cleaning the skin with alcohol a 20-gauge needle was used to inject the right knee joint. There were no complications. A sterile bandage was applied.   Encounter Diagnoses  Name Primary?  . Osteoarthritis of both knees, unspecified osteoarthritis type Yes  . Bilateral chronic knee pain

## 2018-04-20 NOTE — Addendum Note (Signed)
Addended by: Arther Abbott E on: 04/20/2018 11:58 AM   Modules accepted: Orders

## 2018-05-03 ENCOUNTER — Other Ambulatory Visit: Payer: Self-pay | Admitting: Orthopedic Surgery

## 2018-06-27 ENCOUNTER — Encounter: Payer: Self-pay | Admitting: Orthopedic Surgery

## 2018-06-27 ENCOUNTER — Ambulatory Visit (INDEPENDENT_AMBULATORY_CARE_PROVIDER_SITE_OTHER): Payer: Medicare HMO | Admitting: Orthopedic Surgery

## 2018-06-27 VITALS — BP 141/73 | HR 78 | Ht 67.0 in | Wt 130.0 lb

## 2018-06-27 DIAGNOSIS — M170 Bilateral primary osteoarthritis of knee: Secondary | ICD-10-CM | POA: Diagnosis not present

## 2018-06-27 DIAGNOSIS — M25561 Pain in right knee: Secondary | ICD-10-CM | POA: Diagnosis not present

## 2018-06-27 DIAGNOSIS — M25562 Pain in left knee: Secondary | ICD-10-CM

## 2018-06-27 MED ORDER — HYDROCODONE-ACETAMINOPHEN 5-325 MG PO TABS: 1.0000 | ORAL_TABLET | Freq: Three times a day (TID) | ORAL | 0 refills | Status: DC | PRN

## 2018-06-27 NOTE — Progress Notes (Signed)
   Chief Complaint  Patient presents with  . Knee Pain    bialteral  . Injections    bilateral     Procedure note left knee injection verbal consent was obtained to inject left knee joint  Timeout was completed to confirm the site of injection  The medications used were 40 mg of Depo-Medrol and 1% lidocaine 3 cc  Anesthesia was provided by ethyl chloride and the skin was prepped with alcohol.  After cleaning the skin with alcohol a 20-gauge needle was used to inject the left knee joint. There were no complications. A sterile bandage was applied.   Procedure note right knee injection verbal consent was obtained to inject right knee joint  Timeout was completed to confirm the site of injection  The medications used were 40 mg of Depo-Medrol and 1% lidocaine 3 cc  Anesthesia was provided by ethyl chloride and the skin was prepped with alcohol.  After cleaning the skin with alcohol a 20-gauge needle was used to inject the right knee joint. There were no complications. A sterile bandage was applied.   Encounter Diagnosis  Name Primary?  . Osteoarthritis of both knees, unspecified osteoarthritis type Yes    Fu 2 months

## 2018-07-12 DIAGNOSIS — E785 Hyperlipidemia, unspecified: Secondary | ICD-10-CM | POA: Diagnosis not present

## 2018-07-12 DIAGNOSIS — M130 Polyarthritis, unspecified: Secondary | ICD-10-CM | POA: Diagnosis not present

## 2018-07-12 DIAGNOSIS — Z6820 Body mass index (BMI) 20.0-20.9, adult: Secondary | ICD-10-CM | POA: Diagnosis not present

## 2018-07-12 DIAGNOSIS — D649 Anemia, unspecified: Secondary | ICD-10-CM | POA: Diagnosis not present

## 2018-07-12 DIAGNOSIS — I10 Essential (primary) hypertension: Secondary | ICD-10-CM | POA: Diagnosis not present

## 2018-08-31 ENCOUNTER — Ambulatory Visit: Payer: Medicare HMO | Admitting: Orthopedic Surgery

## 2018-08-31 DIAGNOSIS — M25562 Pain in left knee: Secondary | ICD-10-CM | POA: Diagnosis not present

## 2018-08-31 DIAGNOSIS — M25561 Pain in right knee: Secondary | ICD-10-CM | POA: Diagnosis not present

## 2018-08-31 MED ORDER — HYDROCODONE-ACETAMINOPHEN 5-325 MG PO TABS: 1.0000 | ORAL_TABLET | Freq: Three times a day (TID) | ORAL | 0 refills | Status: DC | PRN

## 2018-08-31 NOTE — Progress Notes (Signed)
No chief complaint on file.   Encounter Diagnosis  Name Primary?  Marland Kitchen Arthralgia of both lower legs      Procedure note left knee injection verbal consent was obtained to inject left knee joint  Timeout was completed to confirm the site of injection  The medications used were 40 mg of Depo-Medrol and 1% lidocaine 3 cc  Anesthesia was provided by ethyl chloride and the skin was prepped with alcohol.  After cleaning the skin with alcohol a 20-gauge needle was used to inject the left knee joint. There were no complications. A sterile bandage was applied.   Procedure note right knee injection verbal consent was obtained to inject right knee joint  Timeout was completed to confirm the site of injection  The medications used were 40 mg of Depo-Medrol and 1% lidocaine 3 cc  Anesthesia was provided by ethyl chloride and the skin was prepped with alcohol.  After cleaning the skin with alcohol a 20-gauge needle was used to inject the right knee joint. There were no complications. A sterile bandage was applied.  Encounter Diagnosis  Name Primary?  Marland Kitchen Arthralgia of both lower legs     FU 2 MONTHS

## 2018-09-11 ENCOUNTER — Other Ambulatory Visit: Payer: Self-pay

## 2018-09-11 ENCOUNTER — Encounter (HOSPITAL_COMMUNITY): Payer: Self-pay | Admitting: Emergency Medicine

## 2018-09-11 ENCOUNTER — Emergency Department (HOSPITAL_COMMUNITY)
Admission: EM | Admit: 2018-09-11 | Discharge: 2018-09-11 | Disposition: A | Discharge status: Home / Self Care | Payer: Medicare HMO | Attending: Emergency Medicine | Admitting: Emergency Medicine

## 2018-09-11 ENCOUNTER — Emergency Department (HOSPITAL_COMMUNITY): Payer: Medicare HMO

## 2018-09-11 DIAGNOSIS — S61215A Laceration without foreign body of left ring finger without damage to nail, initial encounter: Secondary | ICD-10-CM | POA: Diagnosis not present

## 2018-09-11 DIAGNOSIS — Z79899 Other long term (current) drug therapy: Secondary | ICD-10-CM | POA: Insufficient documentation

## 2018-09-11 DIAGNOSIS — W231XXA Caught, crushed, jammed, or pinched between stationary objects, initial encounter: Secondary | ICD-10-CM | POA: Insufficient documentation

## 2018-09-11 DIAGNOSIS — I10 Essential (primary) hypertension: Secondary | ICD-10-CM | POA: Diagnosis not present

## 2018-09-11 DIAGNOSIS — M79642 Pain in left hand: Secondary | ICD-10-CM

## 2018-09-11 DIAGNOSIS — F0390 Unspecified dementia without behavioral disturbance: Secondary | ICD-10-CM | POA: Diagnosis not present

## 2018-09-11 DIAGNOSIS — Y92810 Car as the place of occurrence of the external cause: Secondary | ICD-10-CM | POA: Insufficient documentation

## 2018-09-11 DIAGNOSIS — Y999 Unspecified external cause status: Secondary | ICD-10-CM | POA: Diagnosis not present

## 2018-09-11 DIAGNOSIS — S61412A Laceration without foreign body of left hand, initial encounter: Secondary | ICD-10-CM | POA: Diagnosis not present

## 2018-09-11 DIAGNOSIS — Y9389 Activity, other specified: Secondary | ICD-10-CM | POA: Insufficient documentation

## 2018-09-11 MED ORDER — TETANUS-DIPHTH-ACELL PERTUSSIS 5-2.5-18.5 LF-MCG/0.5 IM SUSP
0.5000 mL | Freq: Once | INTRAMUSCULAR | Status: AC
  Administered 2018-09-11: 0.5 mL via INTRAMUSCULAR
  Filled 2018-09-11: qty 0.5

## 2018-09-11 NOTE — Discharge Instructions (Addendum)
I have placed steri strips on your wound, these will fall off in the next 5 days. You may alternate ibuprofen or tylenol for your pain. If you experience any worsening symptoms, drainage from the wound you may return to the ED

## 2018-09-11 NOTE — ED Triage Notes (Signed)
PT was in her driveway talking talking leaned up again the car and the car door closed onto her left hand ring finger. PT c/o pain and swelling to digit and some laceration with bleeding controlled.

## 2018-09-11 NOTE — ED Provider Notes (Signed)
[facility] EMERGENCY DEPARTMENT Provider Note   CSN: [id number] Arrival date & time: 09/11/18  1505     History   Chief Complaint Chief Complaint  Patient presents with  . Hand Injury    HPI [name] is a 84 y.o. female.  84 y/o female with a PMH of Anxiety, Dementia, HTN presents to the ED s/p left hand injury. Per family member at the bedside patient has hugging a friend by the car when they closed the door of the car on her hand as they did not see her was right on the door.  She reports pain with movement of her left hand.  No medical therapy has been try for relieving symptoms.     Past Medical History:  Diagnosis Date  . Anxiety   . Dementia, senile, with, delirium 10/06/2011  . Depression   . Diverticulosis    Colon   . Hepatic hemangioma   . Hx pulmonary embolism   . Hyperglycemia 10/06/2011  . Hyperlipidemia   . Hypertension   . Normocytic anemia   . Osteopenia 10/08/2003   Dx   . Polypharmacy     Patient Active Problem List   Diagnosis Date Noted  . Dementia, senile, with, delirium 10/06/2011  . Anemia 10/06/2011  . Hyperglycemia 10/06/2011  . Gait abnormality 10/05/2011  . Dehydration 10/05/2011  . Hypokalemia 10/05/2011  . Osteoarthritis 10/05/2011  . Dementia ([place]) 10/05/2011  . ARF (acute renal failure) ([place]) 10/05/2011  . Knee pain 09/10/2011  . OA (osteoarthritis) of knee 09/10/2011  . KNEE, ARTHRITIS, DEGEN./OSTEO 09/11/2009  . KNEE PAIN 01/21/2009  . FIBROMYALGIA/FIBROMYOSITIS 12/10/2008  . FRACTURE, RADIUS, DISTAL 12/10/2008  . WRIST PAIN 09/06/2007  . COLLES' FRACTURE, RIGHT 08/24/2007  . HEMANGIOMA, HEPATIC 09/23/2006  . HYPERLIPIDEMIA 09/23/2006  . ANEMIA, NORMOCYTIC 09/23/2006  . DEPRESSION 09/23/2006  . HYPERTENSION 09/23/2006  . DIVERTICULOSIS, COLON 09/23/2006  . OSTEOPENIA 09/23/2006  . PULMONARY EMBOLISM, HX OF 09/23/2006    Past Surgical History:  Procedure Laterality Date  . ABDOMINAL HYSTERECTOMY    .  BALLOON DILATION N/A 04/21/2016   Procedure: BALLOON DILATION;  Surgeon: Wonda Horner, MD;  Location: [facility];  Service: Endoscopy;  Laterality: N/A;  . ESOPHAGOGASTRODUODENOSCOPY (EGD) WITH PROPOFOL N/A 04/21/2016   Procedure: ESOPHAGOGASTRODUODENOSCOPY (EGD) WITH PROPOFOL;  Surgeon: Wonda Horner, MD;  Location: [facility];  Service: Endoscopy;  Laterality: N/A;  . fibroid tumor on shoulder    . Rt arm /shoulder surgery    . Rt Closed Reduction Percutaneous Pins Ext Fixator  08/26/2007   Dr. Aline Brochure  . vaginal  delivery     x10     OB History   None      Home Medications    Prior to Admission medications   Medication Sig Start Date End Date Taking? Authorizing Provider  ALPRAZolam Duanne Moron) 0.5 MG tablet TAKE (1) TABLET BY MOUTH AT BEDTIME. 05/03/18   Carole Civil, MD  hydrochlorothiazide (HYDRODIURIL) 25 MG tablet  02/14/18   [provider]  HYDROcodone-acetaminophen (NORCO) 5-325 MG tablet Take 1 tablet by mouth every 8 (eight) hours as needed for moderate pain. Deliver if possible 08/31/18   Carole Civil, MD  memantine (NAMENDA) 5 MG tablet Take 5 mg by mouth 2 (two) times daily.    [provider]  Menthol (ICY HOT) 5 % PTCH Place 1 patch onto the skin every 8 (eight) hours as needed (For pain.).    [provider]  metoprolol succinate (TOPROL-XL) 50  MG 24 hr tablet Take 50 mg by mouth daily. 04/01/16   [provider]  omeprazole (PRILOSEC) 20 MG capsule  01/19/18   [provider]    Family History Family History  Problem Relation Age of Onset  . Diabetes Unknown        family history   . Cancer Unknown        family history   . Diabetes Father   . Cancer Sister        Breast  . Cancer Brother        Lung    Social History Social History   Tobacco Use  . Smoking status: Never Smoker  . Smokeless tobacco: Never Used  Substance Use Topics  . Alcohol use: Yes    Comment: wine rarely  . Drug use: No      Allergies   Erythromycin   Review of Systems Review of Systems  Musculoskeletal: Positive for arthralgias.  Skin: Positive for wound.     Physical Exam Updated Vital Signs BP (!) 148/98 (BP Location: Right Arm)   Pulse 84   Temp 98.3 F (36.8 C) (Oral)   Resp 18   Ht 5\' 7"  (1.702 m)   Wt 77.1 kg   SpO2 99%   BMI 26.63 kg/m   Physical Exam  Constitutional: She is oriented to person, place, and time. She appears well-developed and well-nourished.  HENT:  Head: Normocephalic and atraumatic.  Neck: Normal range of motion. Neck supple.  Cardiovascular: Normal heart sounds.  Pulmonary/Chest: Effort normal.  Abdominal: Soft.  Musculoskeletal: She exhibits tenderness.       Right hand: She exhibits tenderness and laceration. Normal sensation noted. Normal strength noted.       Hands: ttp along 4th metacarpal, pulses present, neurologically intact.   Neurological: She is alert and oriented to person, place, and time.  Skin: Skin is warm. There is erythema.  Nursing note and vitals reviewed.    ED Treatments / Results  Labs (all labs ordered are listed, but only abnormal results are displayed) Labs Reviewed - No data to display  EKG None  Radiology Dg Hand Complete Left  Result Date: 09/11/2018 CLINICAL DATA:  Left hand closed in car door, left ring finger has most pain and laceration, all fingers were closed in the door EXAM: LEFT HAND - COMPLETE 3+ VIEW COMPARISON:  None. FINDINGS: No fracture.  No bone lesion. Joints are normally aligned. There is joint space narrowing with mild subchondral sclerosis and marginal osteophytes at the trapezium first metacarpal articulation consistent with osteoarthritis. Bones are diffusely demineralized. Small soft tissue laceration along the palmar aspect of the mid to distal ring finger. No radiopaque foreign body. IMPRESSION: No fracture, dislocation or radiopaque foreign body. Electronically Signed   By: Lajean Manes M.D.    On: 09/11/2018 15:47    Procedures .Marland KitchenLaceration Repair Date/Time: 09/11/2018 4:42 PM Performed by: Janeece Fitting, PA-C Authorized by: Janeece Fitting, PA-C   Consent:    Consent obtained:  Verbal   Consent given by:  Patient   Risks discussed:  Poor wound healing, poor cosmetic result, infection and pain Anesthesia (see MAR for exact dosages):    Anesthesia method:  None Laceration details:    Location:  Hand   Hand location:  L palm   Length (cm):  1   Depth (mm):  0.5 Repair type:    Repair type:  Simple Exploration:    Hemostasis achieved with:  Direct pressure Treatment:  Area cleansed with:  Shur-Clens   Amount of cleaning:  Extensive   Irrigation method:  Tap Skin repair:    Repair method:  Steri-Strips   Number of Steri-Strips:  2 Approximation:    Approximation:  Close Post-procedure details:    Dressing:  Bulky dressing   Patient tolerance of procedure:  Tolerated well, no immediate complications   (including critical care time)  Medications Ordered in ED Medications  Tdap (BOOSTRIX) injection 0.5 mL (0.5 mLs Intramuscular Given 09/11/18 1630)     Initial Impression / Assessment and Plan / ED Course  I have reviewed the triage vital signs and the nursing notes.  Pertinent labs & imaging results that were available during my care of the patient were reviewed by me and considered in my medical decision making (see chart for details).   Presents with left hand pain after having her hand closed shut with a car door.  DG left hand order to rule out any bone abnormalities.  DG left hand resulted no fracture, dislocation or radiopaque foreign body.  On examination there is small soft tissue swelling along with a small laceration to the distal ring finger on her left hand. She has good pulses, neurologically intact.  Her tetanus status is unknown, will update this for patient.  I have personally cleaned and repaired patient's laceration with steri strips along with  placed a bulky dressing, son at the bedside is advised to give tylenol for the pain along with RICE therapy. Patient and family member understand and agree with plan.Return precautions provided.   Final Clinical Impressions(s) / ED Diagnoses   Final diagnoses:  Laceration of left ring finger without foreign body without damage to nail, initial encounter  Left hand pain    ED Discharge Orders    None       Janeece Fitting, PA-C 09/11/18 1647    Nat Christen, MD 09/12/18 1845

## 2018-10-11 DIAGNOSIS — Z0000 Encounter for general adult medical examination without abnormal findings: Secondary | ICD-10-CM | POA: Diagnosis not present

## 2018-10-11 DIAGNOSIS — I10 Essential (primary) hypertension: Secondary | ICD-10-CM | POA: Diagnosis not present

## 2018-10-11 DIAGNOSIS — D649 Anemia, unspecified: Secondary | ICD-10-CM | POA: Diagnosis not present

## 2018-10-11 DIAGNOSIS — N390 Urinary tract infection, site not specified: Secondary | ICD-10-CM | POA: Diagnosis not present

## 2018-11-02 ENCOUNTER — Ambulatory Visit: Payer: Medicare HMO | Admitting: Orthopedic Surgery

## 2018-11-02 ENCOUNTER — Encounter: Payer: Self-pay | Admitting: Orthopedic Surgery

## 2018-11-02 VITALS — BP 154/75 | HR 69 | Ht 67.0 in | Wt 130.0 lb

## 2018-11-02 DIAGNOSIS — M25561 Pain in right knee: Secondary | ICD-10-CM

## 2018-11-02 DIAGNOSIS — M170 Bilateral primary osteoarthritis of knee: Secondary | ICD-10-CM | POA: Diagnosis not present

## 2018-11-02 DIAGNOSIS — M25562 Pain in left knee: Secondary | ICD-10-CM

## 2018-11-02 MED ORDER — HYDROCODONE-ACETAMINOPHEN 5-325 MG PO TABS: 1.0000 | ORAL_TABLET | Freq: Three times a day (TID) | ORAL | 0 refills | Status: DC | PRN

## 2018-11-02 NOTE — Progress Notes (Signed)
Chief Complaint  Patient presents with  . Injections    bilateral knees    Encounter Diagnoses  Name Primary?  . Osteoarthritis of both knees, unspecified osteoarthritis type Yes  . Arthralgia of both lower legs      Procedure note left knee injection verbal consent was obtained to inject left knee joint  Timeout was completed to confirm the site of injection  The medications used were 40 mg of Depo-Medrol and 1% lidocaine 3 cc  Anesthesia was provided by ethyl chloride and the skin was prepped with alcohol.  After cleaning the skin with alcohol a 20-gauge needle was used to inject the left knee joint. There were no complications. A sterile bandage was applied.   Procedure note right knee injection verbal consent was obtained to inject right knee joint  Timeout was completed to confirm the site of injection  The medications used were 40 mg of Depo-Medrol and 1% lidocaine 3 cc  Anesthesia was provided by ethyl chloride and the skin was prepped with alcohol.  After cleaning the skin with alcohol a 20-gauge needle was used to inject the right knee joint. There were no complications. A sterile bandage was applied.  Fu 2 months

## 2019-01-04 ENCOUNTER — Ambulatory Visit: Payer: Medicare HMO | Admitting: Orthopedic Surgery

## 2019-01-04 ENCOUNTER — Encounter: Payer: Self-pay | Admitting: Orthopedic Surgery

## 2019-01-04 DIAGNOSIS — M25561 Pain in right knee: Secondary | ICD-10-CM | POA: Diagnosis not present

## 2019-01-04 DIAGNOSIS — M25562 Pain in left knee: Secondary | ICD-10-CM

## 2019-01-04 MED ORDER — HYDROCODONE-ACETAMINOPHEN 5-325 MG PO TABS: 1.0000 | ORAL_TABLET | Freq: Three times a day (TID) | ORAL | 0 refills | Status: DC | PRN

## 2019-01-04 NOTE — Progress Notes (Signed)
Chief Complaint  Patient presents with  . Knee Pain    bilateral knee pain     Procedure note left knee injection verbal consent was obtained to inject left knee joint  Timeout was completed to confirm the site of injection  The medications used were 40 mg of Depo-Medrol and 1% lidocaine 3 cc  Anesthesia was provided by ethyl chloride and the skin was prepped with alcohol.  After cleaning the skin with alcohol a 20-gauge needle was used to inject the left knee joint. There were no complications. A sterile bandage was applied.   Procedure note right knee injection verbal consent was obtained to inject right knee joint  Timeout was completed to confirm the site of injection  The medications used were 40 mg of Depo-Medrol and 1% lidocaine 3 cc  Anesthesia was provided by ethyl chloride and the skin was prepped with alcohol.  After cleaning the skin with alcohol a 20-gauge needle was used to inject the right knee joint. There were no complications. A sterile bandage was applied.  Encounter Diagnosis  Name Primary?  Marland Kitchen Arthralgia of both lower legs     Meds ordered this encounter  Medications  . HYDROcodone-acetaminophen (NORCO) 5-325 MG tablet    Sig: Take 1 tablet by mouth every 8 (eight) hours as needed for moderate pain. Deliver if possible    Dispense:  90 tablet    Refill:  0

## 2019-01-17 DIAGNOSIS — Z6820 Body mass index (BMI) 20.0-20.9, adult: Secondary | ICD-10-CM | POA: Diagnosis not present

## 2019-01-17 DIAGNOSIS — M130 Polyarthritis, unspecified: Secondary | ICD-10-CM | POA: Diagnosis not present

## 2019-01-17 DIAGNOSIS — D649 Anemia, unspecified: Secondary | ICD-10-CM | POA: Diagnosis not present

## 2019-01-17 DIAGNOSIS — I10 Essential (primary) hypertension: Secondary | ICD-10-CM | POA: Diagnosis not present

## 2019-03-08 ENCOUNTER — Ambulatory Visit: Payer: Medicare HMO | Admitting: Orthopedic Surgery

## 2019-03-10 ENCOUNTER — Encounter: Payer: Self-pay | Admitting: Orthopedic Surgery

## 2019-03-10 ENCOUNTER — Other Ambulatory Visit: Payer: Self-pay

## 2019-03-10 ENCOUNTER — Ambulatory Visit: Payer: Medicare HMO | Admitting: Orthopedic Surgery

## 2019-03-10 VITALS — BP 136/76 | HR 84 | Temp 99.0°F | Ht 67.0 in | Wt 136.0 lb

## 2019-03-10 DIAGNOSIS — M25562 Pain in left knee: Secondary | ICD-10-CM

## 2019-03-10 DIAGNOSIS — M25561 Pain in right knee: Secondary | ICD-10-CM | POA: Diagnosis not present

## 2019-03-10 DIAGNOSIS — G8929 Other chronic pain: Secondary | ICD-10-CM

## 2019-03-10 MED ORDER — HYDROCODONE-ACETAMINOPHEN 5-325 MG PO TABS: 1.0000 | ORAL_TABLET | Freq: Three times a day (TID) | ORAL | 0 refills | Status: DC | PRN

## 2019-03-10 NOTE — Progress Notes (Signed)
Chief Complaint  Patient presents with  . Injections    bilateral knees  . Medication Refill    hydrocodone     Procedure note left knee injection verbal consent was obtained to inject left knee joint  Timeout was completed to confirm the site of injection  The medications used were 40 mg of Depo-Medrol and 1% lidocaine 3 cc  Anesthesia was provided by ethyl chloride and the skin was prepped with alcohol.  After cleaning the skin with alcohol a 20-gauge needle was used to inject the left knee joint. There were no complications. A sterile bandage was applied.   Procedure note right knee injection verbal consent was obtained to inject right knee joint  Timeout was completed to confirm the site of injection  The medications used were 40 mg of Depo-Medrol and 1% lidocaine 3 cc  Anesthesia was provided by ethyl chloride and the skin was prepped with alcohol.  After cleaning the skin with alcohol a 20-gauge needle was used to inject the right knee joint. There were no complications. A sterile bandage was applied.   Encounter Diagnoses  Name Primary?  Marland Kitchen Arthralgia of both lower legs Yes  . Bilateral chronic knee pain    Meds ordered this encounter  Medications  . HYDROcodone-acetaminophen (NORCO) 5-325 MG tablet    Sig: Take 1 tablet by mouth every 8 (eight) hours as needed for moderate pain. Deliver if possible    Dispense:  90 tablet    Refill:  0   The patient is not having any complications from the opioid medication.  The medication is providing good pain relief and allowing her to have some reasonable activities of daily living including getting in and out of a chair and walking around the home   Fu 2 months for injections

## 2019-04-18 DIAGNOSIS — I10 Essential (primary) hypertension: Secondary | ICD-10-CM | POA: Diagnosis not present

## 2019-04-18 DIAGNOSIS — S0093XA Contusion of unspecified part of head, initial encounter: Secondary | ICD-10-CM | POA: Diagnosis not present

## 2019-05-10 ENCOUNTER — Encounter: Payer: Self-pay | Admitting: Orthopedic Surgery

## 2019-05-10 ENCOUNTER — Ambulatory Visit (INDEPENDENT_AMBULATORY_CARE_PROVIDER_SITE_OTHER): Payer: Medicare HMO | Admitting: Orthopedic Surgery

## 2019-05-10 ENCOUNTER — Other Ambulatory Visit: Payer: Self-pay

## 2019-05-10 VITALS — BP 150/86 | HR 84 | Temp 98.6°F | Ht 67.0 in | Wt 135.0 lb

## 2019-05-10 DIAGNOSIS — M25561 Pain in right knee: Secondary | ICD-10-CM

## 2019-05-10 DIAGNOSIS — M170 Bilateral primary osteoarthritis of knee: Secondary | ICD-10-CM

## 2019-05-10 DIAGNOSIS — M25562 Pain in left knee: Secondary | ICD-10-CM

## 2019-05-10 MED ORDER — HYDROCODONE-ACETAMINOPHEN 5-325 MG PO TABS: 1.0000 | ORAL_TABLET | Freq: Three times a day (TID) | ORAL | 0 refills | Status: DC | PRN

## 2019-05-10 MED ORDER — ALPRAZOLAM 0.5 MG PO TABS: ORAL_TABLET | ORAL | 0 refills | Status: DC

## 2019-05-10 NOTE — Progress Notes (Signed)
Chief Complaint  Patient presents with  . Knee Pain    Bilateral wants injections    Encounter Diagnoses  Name Primary?  Marland Kitchen Arthralgia of both lower legs   . Primary osteoarthritis of both knees Yes     Procedure note for bilateral knee injections  Procedure note left knee injection verbal consent was obtained to inject left knee joint  Timeout was completed to confirm the site of injection  The medications used were 40 mg of Depo-Medrol and 1% lidocaine 3 cc  Anesthesia was provided by ethyl chloride and the skin was prepped with alcohol.  After cleaning the skin with alcohol a 20-gauge needle was used to inject the left knee joint. There were no complications. A sterile bandage was applied.   Procedure note right knee injection verbal consent was obtained to inject right knee joint  Timeout was completed to confirm the site of injection  The medications used were 40 mg of Depo-Medrol and 1% lidocaine 3 cc  Anesthesia was provided by ethyl chloride and the skin was prepped with alcohol.  After cleaning the skin with alcohol a 20-gauge needle was used to inject the right knee joint. There were no complications. A sterile bandage was applied.  Meds ordered this encounter  Medications  . HYDROcodone-acetaminophen (NORCO) 5-325 MG tablet    Sig: Take 1 tablet by mouth every 8 (eight) hours as needed for moderate pain. Deliver if possible    Dispense:  90 tablet    Refill:  0  . ALPRAZolam (XANAX) 0.5 MG tablet    Sig: TAKE (1) TABLET BY MOUTH AT BEDTIME.    Dispense:  30 tablet    Refill:  0

## 2019-06-09 ENCOUNTER — Other Ambulatory Visit: Payer: Self-pay | Admitting: Orthopedic Surgery

## 2019-07-12 ENCOUNTER — Ambulatory Visit: Payer: Medicare HMO | Admitting: Orthopedic Surgery

## 2019-07-13 ENCOUNTER — Other Ambulatory Visit: Payer: Self-pay

## 2019-07-13 ENCOUNTER — Encounter: Payer: Self-pay | Admitting: Orthopedic Surgery

## 2019-07-13 ENCOUNTER — Ambulatory Visit (INDEPENDENT_AMBULATORY_CARE_PROVIDER_SITE_OTHER): Payer: Medicare HMO | Admitting: Orthopedic Surgery

## 2019-07-13 VITALS — BP 126/74 | HR 92 | Temp 98.6°F | Ht 67.0 in | Wt 135.0 lb

## 2019-07-13 DIAGNOSIS — M170 Bilateral primary osteoarthritis of knee: Secondary | ICD-10-CM | POA: Diagnosis not present

## 2019-07-13 NOTE — Progress Notes (Signed)
Chief Complaint  Patient presents with  . Injections    both knees     Procedure note for bilateral knee injections  Procedure note left knee injection verbal consent was obtained to inject left knee joint  Timeout was completed to confirm the site of injection  The medications used were 40 mg of Depo-Medrol and 1% lidocaine 3 cc  Anesthesia was provided by ethyl chloride and the skin was prepped with alcohol.  After cleaning the skin with alcohol a 20-gauge needle was used to inject the left knee joint. There were no complications. A sterile bandage was applied.   Procedure note right knee injection verbal consent was obtained to inject right knee joint  Timeout was completed to confirm the site of injection  The medications used were 40 mg of Depo-Medrol and 1% lidocaine 3 cc  Anesthesia was provided by ethyl chloride and the skin was prepped with alcohol.  After cleaning the skin with alcohol a 20-gauge needle was used to inject the right knee joint. There were no complications. A sterile bandage was applied.

## 2019-07-14 ENCOUNTER — Other Ambulatory Visit: Payer: Self-pay | Admitting: Orthopedic Surgery

## 2019-07-14 DIAGNOSIS — M25561 Pain in right knee: Secondary | ICD-10-CM

## 2019-07-14 DIAGNOSIS — M25562 Pain in left knee: Secondary | ICD-10-CM

## 2019-08-01 DIAGNOSIS — R51 Headache: Secondary | ICD-10-CM | POA: Diagnosis not present

## 2019-08-01 DIAGNOSIS — I10 Essential (primary) hypertension: Secondary | ICD-10-CM | POA: Diagnosis not present

## 2019-08-01 DIAGNOSIS — F0390 Unspecified dementia without behavioral disturbance: Secondary | ICD-10-CM | POA: Diagnosis not present

## 2019-08-14 ENCOUNTER — Other Ambulatory Visit: Payer: Self-pay | Admitting: Orthopedic Surgery

## 2019-09-11 ENCOUNTER — Other Ambulatory Visit: Payer: Self-pay | Admitting: Orthopedic Surgery

## 2019-09-13 ENCOUNTER — Encounter: Payer: Self-pay | Admitting: Orthopedic Surgery

## 2019-09-13 ENCOUNTER — Other Ambulatory Visit: Payer: Self-pay

## 2019-09-13 ENCOUNTER — Ambulatory Visit (INDEPENDENT_AMBULATORY_CARE_PROVIDER_SITE_OTHER): Payer: Medicare HMO | Admitting: Orthopedic Surgery

## 2019-09-13 VITALS — BP 89/54 | HR 91 | Ht 67.0 in

## 2019-09-13 DIAGNOSIS — G8929 Other chronic pain: Secondary | ICD-10-CM

## 2019-09-13 DIAGNOSIS — M25562 Pain in left knee: Secondary | ICD-10-CM

## 2019-09-13 DIAGNOSIS — M25561 Pain in right knee: Secondary | ICD-10-CM

## 2019-09-13 MED ORDER — HYDROCODONE-ACETAMINOPHEN 5-325 MG PO TABS: ORAL_TABLET | ORAL | 0 refills | Status: DC

## 2019-09-13 NOTE — Patient Instructions (Signed)
You have received an injection of steroids into the joint. 15% of patients will have increased pain within the 24 hours postinjection.   This is transient and will go away.   We recommend that you use ice packs on the injection site for 20 minutes every 2 hours and extra strength Tylenol 2 tablets every 8 as needed until the pain resolves.  If you continue to have pain after taking the Tylenol and using the ice please call the office for further instructions.  Pick up prescription at [facility] [address]

## 2019-09-13 NOTE — Progress Notes (Signed)
Encounter Diagnoses  Name Primary?  Marland Kitchen Arthralgia of both lower legs   . Other chronic pain Yes    Meds ordered this encounter  Medications  . HYDROcodone-acetaminophen (NORCO/VICODIN) 5-325 MG tablet    Sig: TAKE 1 TABLET BY MOUTH EVERY 8 HOURS AS NEEDED FOR MODERATE PAIN.    Dispense:  90 tablet    Refill:  0   Procedure note right knee injection   verbal consent was obtained to inject right knee joint  Timeout was completed to confirm the site of injection  The medications used were 40 mg of Depo-Medrol and 1% lidocaine 3 cc  Anesthesia was provided by ethyl chloride and the skin was prepped with alcohol.  After cleaning the skin with alcohol a 20-gauge needle was used to inject the right knee joint. There were no complications. A sterile bandage was applied.  Procedure note left knee injection   verbal consent was obtained to inject left knee joint  Timeout was completed to confirm the site of injection  The medications used were 40 mg of Depo-Medrol and 1% lidocaine 3 cc  Anesthesia was provided by ethyl chloride and the skin was prepped with alcohol.  After cleaning the skin with alcohol a 20-gauge needle was used to inject the left knee joint. There were no complications. A sterile bandage was applied.    Chief Complaint  Patient presents with  . Injections    both knees    Fu 2 months

## 2019-09-22 DIAGNOSIS — F0390 Unspecified dementia without behavioral disturbance: Secondary | ICD-10-CM | POA: Diagnosis not present

## 2019-10-16 ENCOUNTER — Other Ambulatory Visit: Payer: Self-pay | Admitting: Orthopedic Surgery

## 2019-10-31 DIAGNOSIS — M159 Polyosteoarthritis, unspecified: Secondary | ICD-10-CM | POA: Diagnosis not present

## 2019-10-31 DIAGNOSIS — R21 Rash and other nonspecific skin eruption: Secondary | ICD-10-CM | POA: Diagnosis not present

## 2019-10-31 DIAGNOSIS — Z7189 Other specified counseling: Secondary | ICD-10-CM | POA: Diagnosis not present

## 2019-10-31 DIAGNOSIS — I10 Essential (primary) hypertension: Secondary | ICD-10-CM | POA: Diagnosis not present

## 2019-11-08 ENCOUNTER — Other Ambulatory Visit: Payer: Self-pay | Admitting: Orthopedic Surgery

## 2019-11-22 ENCOUNTER — Ambulatory Visit (INDEPENDENT_AMBULATORY_CARE_PROVIDER_SITE_OTHER): Payer: Medicare HMO | Admitting: Orthopedic Surgery

## 2019-11-22 ENCOUNTER — Other Ambulatory Visit: Payer: Self-pay

## 2019-11-22 VITALS — BP 149/87 | HR 82 | Temp 97.2°F | Ht 67.0 in | Wt 126.0 lb

## 2019-11-22 DIAGNOSIS — M25561 Pain in right knee: Secondary | ICD-10-CM

## 2019-11-22 DIAGNOSIS — G8929 Other chronic pain: Secondary | ICD-10-CM | POA: Diagnosis not present

## 2019-11-22 DIAGNOSIS — M25562 Pain in left knee: Secondary | ICD-10-CM

## 2019-11-22 MED ORDER — HYDROCODONE-ACETAMINOPHEN 5-325 MG PO TABS: ORAL_TABLET | ORAL | 0 refills | Status: DC

## 2019-11-22 NOTE — Progress Notes (Signed)
Chief Complaint  Patient presents with  . Follow-up    Bilateral knee injections    [name] comes in for her routine 55-month injections both knees and she would like a refill on her hydrocodone which she takes for chronic pain as she is not able to have surgery based on her age and medical condition  Procedure note for bilateral knee injections  Procedure note left knee injection verbal consent was obtained to inject left knee joint  Timeout was completed to confirm the site of injection  The medications used were 40 mg of Depo-Medrol and 1% lidocaine 3 cc  Anesthesia was provided by ethyl chloride and the skin was prepped with alcohol.  After cleaning the skin with alcohol a 20-gauge needle was used to inject the left knee joint. There were no complications. A sterile bandage was applied.   Procedure note right knee injection verbal consent was obtained to inject right knee joint  Timeout was completed to confirm the site of injection  The medications used were 40 mg of Depo-Medrol and 1% lidocaine 3 cc  Anesthesia was provided by ethyl chloride and the skin was prepped with alcohol.  After cleaning the skin with alcohol a 20-gauge needle was used to inject the right knee joint. There were no complications. A sterile bandage was applied.   Encounter Diagnoses  Name Primary?  Marland Kitchen Arthralgia of both lower legs   . Bilateral chronic knee pain Yes

## 2019-11-22 NOTE — Patient Instructions (Signed)
You have received an injection of steroids into the joint. 15% of patients will have increased pain within the 24 hours postinjection.   This is transient and will go away.   We recommend that you use ice packs on the injection site for 20 minutes every 2 hours and extra strength Tylenol 2 tablets every 8 as needed until the pain resolves.  If you continue to have pain after taking the Tylenol and using the ice please call the office for further instructions.   Continue the hydrocodone for pain

## 2019-12-21 ENCOUNTER — Other Ambulatory Visit: Payer: Self-pay

## 2019-12-21 ENCOUNTER — Observation Stay (HOSPITAL_BASED_OUTPATIENT_CLINIC_OR_DEPARTMENT_OTHER): Payer: Medicare HMO

## 2019-12-21 ENCOUNTER — Inpatient Hospital Stay (HOSPITAL_COMMUNITY)
Admission: EM | Admit: 2019-12-21 | Discharge: 2019-12-24 | DRG: 394 | Disposition: A | Discharge status: Home Health | Payer: Medicare HMO | Attending: Family Medicine | Admitting: Family Medicine

## 2019-12-21 ENCOUNTER — Encounter (HOSPITAL_COMMUNITY): Payer: Self-pay | Admitting: Emergency Medicine

## 2019-12-21 DIAGNOSIS — R1310 Dysphagia, unspecified: Secondary | ICD-10-CM | POA: Diagnosis not present

## 2019-12-21 DIAGNOSIS — Z20822 Contact with and (suspected) exposure to covid-19: Secondary | ICD-10-CM | POA: Diagnosis present

## 2019-12-21 DIAGNOSIS — Z01818 Encounter for other preprocedural examination: Secondary | ICD-10-CM | POA: Diagnosis not present

## 2019-12-21 DIAGNOSIS — R739 Hyperglycemia, unspecified: Secondary | ICD-10-CM | POA: Diagnosis not present

## 2019-12-21 DIAGNOSIS — E871 Hypo-osmolality and hyponatremia: Secondary | ICD-10-CM | POA: Diagnosis not present

## 2019-12-21 DIAGNOSIS — K222 Esophageal obstruction: Secondary | ICD-10-CM | POA: Diagnosis not present

## 2019-12-21 DIAGNOSIS — R1314 Dysphagia, pharyngoesophageal phase: Secondary | ICD-10-CM | POA: Diagnosis present

## 2019-12-21 DIAGNOSIS — Z90710 Acquired absence of both cervix and uterus: Secondary | ICD-10-CM | POA: Diagnosis not present

## 2019-12-21 DIAGNOSIS — I4891 Unspecified atrial fibrillation: Secondary | ICD-10-CM | POA: Diagnosis not present

## 2019-12-21 DIAGNOSIS — Z03818 Encounter for observation for suspected exposure to other biological agents ruled out: Secondary | ICD-10-CM | POA: Diagnosis not present

## 2019-12-21 DIAGNOSIS — Z79899 Other long term (current) drug therapy: Secondary | ICD-10-CM

## 2019-12-21 DIAGNOSIS — M8580 Other specified disorders of bone density and structure, unspecified site: Secondary | ICD-10-CM | POA: Diagnosis present

## 2019-12-21 DIAGNOSIS — E785 Hyperlipidemia, unspecified: Secondary | ICD-10-CM | POA: Diagnosis present

## 2019-12-21 DIAGNOSIS — Z86711 Personal history of pulmonary embolism: Secondary | ICD-10-CM | POA: Diagnosis not present

## 2019-12-21 DIAGNOSIS — Z881 Allergy status to other antibiotic agents status: Secondary | ICD-10-CM

## 2019-12-21 DIAGNOSIS — Z803 Family history of malignant neoplasm of breast: Secondary | ICD-10-CM | POA: Diagnosis not present

## 2019-12-21 DIAGNOSIS — Z801 Family history of malignant neoplasm of trachea, bronchus and lung: Secondary | ICD-10-CM

## 2019-12-21 DIAGNOSIS — I4819 Other persistent atrial fibrillation: Secondary | ICD-10-CM | POA: Diagnosis not present

## 2019-12-21 DIAGNOSIS — W44F3XA Food entering into or through a natural orifice, initial encounter: Secondary | ICD-10-CM | POA: Diagnosis present

## 2019-12-21 DIAGNOSIS — I10 Essential (primary) hypertension: Secondary | ICD-10-CM | POA: Diagnosis not present

## 2019-12-21 DIAGNOSIS — R339 Retention of urine, unspecified: Secondary | ICD-10-CM | POA: Diagnosis not present

## 2019-12-21 DIAGNOSIS — T18128A Food in esophagus causing other injury, initial encounter: Secondary | ICD-10-CM

## 2019-12-21 DIAGNOSIS — T18128D Food in esophagus causing other injury, subsequent encounter: Secondary | ICD-10-CM | POA: Diagnosis not present

## 2019-12-21 DIAGNOSIS — E876 Hypokalemia: Secondary | ICD-10-CM | POA: Diagnosis not present

## 2019-12-21 DIAGNOSIS — F329 Major depressive disorder, single episode, unspecified: Secondary | ICD-10-CM | POA: Diagnosis present

## 2019-12-21 DIAGNOSIS — T502X5A Adverse effect of carbonic-anhydrase inhibitors, benzothiadiazides and other diuretics, initial encounter: Secondary | ICD-10-CM | POA: Diagnosis not present

## 2019-12-21 DIAGNOSIS — Z79891 Long term (current) use of opiate analgesic: Secondary | ICD-10-CM

## 2019-12-21 DIAGNOSIS — Z833 Family history of diabetes mellitus: Secondary | ICD-10-CM | POA: Diagnosis not present

## 2019-12-21 DIAGNOSIS — F419 Anxiety disorder, unspecified: Secondary | ICD-10-CM | POA: Diagnosis present

## 2019-12-21 DIAGNOSIS — Y92009 Unspecified place in unspecified non-institutional (private) residence as the place of occurrence of the external cause: Secondary | ICD-10-CM

## 2019-12-21 DIAGNOSIS — R7303 Prediabetes: Secondary | ICD-10-CM | POA: Diagnosis present

## 2019-12-21 DIAGNOSIS — Z7189 Other specified counseling: Secondary | ICD-10-CM

## 2019-12-21 DIAGNOSIS — F0390 Unspecified dementia without behavioral disturbance: Secondary | ICD-10-CM | POA: Diagnosis not present

## 2019-12-21 DIAGNOSIS — Z86718 Personal history of other venous thrombosis and embolism: Secondary | ICD-10-CM | POA: Diagnosis not present

## 2019-12-21 DIAGNOSIS — I959 Hypotension, unspecified: Secondary | ICD-10-CM | POA: Diagnosis not present

## 2019-12-21 LAB — BASIC METABOLIC PANEL
Anion gap: 17 — ABNORMAL HIGH (ref 5–15)
Anion gap: 11 (ref 5–15)
Anion gap: 10 (ref 5–15)
BUN: 7 mg/dL — ABNORMAL LOW (ref 8–23)
BUN: 7 mg/dL — ABNORMAL LOW (ref 8–23)
BUN: 8 mg/dL (ref 8–23)
CO2: 20 mmol/L — ABNORMAL LOW (ref 22–32)
CO2: 21 mmol/L — ABNORMAL LOW (ref 22–32)
CO2: 21 mmol/L — ABNORMAL LOW (ref 22–32)
Calcium: 8.1 mg/dL — ABNORMAL LOW (ref 8.9–10.3)
Calcium: 7.7 mg/dL — ABNORMAL LOW (ref 8.9–10.3)
Calcium: 7.4 mg/dL — ABNORMAL LOW (ref 8.9–10.3)
Chloride: 74 mmol/L — ABNORMAL LOW (ref 98–111)
Chloride: 80 mmol/L — ABNORMAL LOW (ref 98–111)
Chloride: 81 mmol/L — ABNORMAL LOW (ref 98–111)
Creatinine, Ser: 0.53 mg/dL (ref 0.44–1.00)
Creatinine, Ser: 0.45 mg/dL (ref 0.44–1.00)
Creatinine, Ser: 0.46 mg/dL (ref 0.44–1.00)
GFR calc Af Amer: >60 mL/min (ref >60)
GFR calc Af Amer: >60 mL/min (ref >60)
GFR calc Af Amer: >60 mL/min (ref >60)
GFR calc non Af Amer: >60 mL/min (ref >60)
GFR calc non Af Amer: >60 mL/min (ref >60)
GFR calc non Af Amer: >60 mL/min (ref >60)
Glucose, Bld: 245 mg/dL — ABNORMAL HIGH (ref 70–99)
Glucose, Bld: 205 mg/dL — ABNORMAL HIGH (ref 70–99)
Glucose, Bld: 139 mg/dL — ABNORMAL HIGH (ref 70–99)
Potassium: 2.3 mmol/L — CL (ref 3.5–5.1)
Potassium: 2.9 mmol/L — ABNORMAL LOW (ref 3.5–5.1)
Potassium: 3.5 mmol/L (ref 3.5–5.1)
Sodium: 111 mmol/L — CL (ref 135–145)
Sodium: 112 mmol/L — CL (ref 135–145)
Sodium: 112 mmol/L — CL (ref 135–145)

## 2019-12-21 LAB — RESPIRATORY PANEL BY RT PCR (FLU A&B, COVID)
Influenza A by PCR: NEGATIVE
Influenza B by PCR: NEGATIVE
SARS Coronavirus 2 by RT PCR: NEGATIVE

## 2019-12-21 LAB — CBC
HCT: 31.1 % — ABNORMAL LOW (ref 36.0–46.0)
Hemoglobin: 11.2 g/dL — ABNORMAL LOW (ref 12.0–15.0)
MCH: 33.3 pg (ref 26.0–34.0)
MCHC: 36 g/dL (ref 30.0–36.0)
MCV: 92.6 fL (ref 80.0–100.0)
Platelets: 215 10*3/uL (ref 150–400)
RBC: 3.36 MIL/uL — ABNORMAL LOW (ref 3.87–5.11)
RDW: 11.7 % (ref 11.5–15.5)
WBC: 5 10*3/uL (ref 4.0–10.5)
nRBC: 0 % (ref 0.0–0.2)

## 2019-12-21 LAB — ECHOCARDIOGRAM COMPLETE
Height: 67 in
Weight: 2098.78 oz

## 2019-12-21 LAB — GLUCOSE, CAPILLARY: Glucose-Capillary: 132 mg/dL — ABNORMAL HIGH (ref 70–99)

## 2019-12-21 LAB — MAGNESIUM
Magnesium: 1 mg/dL — ABNORMAL LOW (ref 1.7–2.4)
Magnesium: 2.4 mg/dL (ref 1.7–2.4)

## 2019-12-21 MED ORDER — MENTHOL (TOPICAL ANALGESIC) 5 % EX PTCH: 1.0000 | MEDICATED_PATCH | Freq: Three times a day (TID) | CUTANEOUS | Status: DC | PRN

## 2019-12-21 MED ORDER — GLUCAGON HCL RDNA (DIAGNOSTIC) 1 MG IJ SOLR
1.0000 mg | Freq: Once | INTRAMUSCULAR | Status: AC
  Administered 2019-12-21: 11:00:00 1 mg via INTRAVENOUS
  Filled 2019-12-21: qty 1

## 2019-12-21 MED ORDER — POTASSIUM CHLORIDE 10 MEQ/100ML IV SOLN
10.0000 meq | INTRAVENOUS | Status: AC
  Administered 2019-12-21: 08:00:00 10 meq via INTRAVENOUS
  Filled 2019-12-21: qty 100

## 2019-12-21 MED ORDER — SODIUM CHLORIDE 0.9 % IV BOLUS: 1000.0000 mL | Freq: Once | INTRAVENOUS | Status: DC

## 2019-12-21 MED ORDER — ACETAMINOPHEN 650 MG RE SUPP: 650.0000 mg | Freq: Four times a day (QID) | RECTAL | Status: DC | PRN

## 2019-12-21 MED ORDER — METOPROLOL TARTRATE 5 MG/5ML IV SOLN
2.5000 mg | Freq: Three times a day (TID) | INTRAVENOUS | Status: DC
  Administered 2019-12-21 – 2019-12-22 (×3): 2.5 mg via INTRAVENOUS
  Filled 2019-12-21 (×3): qty 5

## 2019-12-21 MED ORDER — ALBUTEROL SULFATE (2.5 MG/3ML) 0.083% IN NEBU: 2.5000 mg | INHALATION_SOLUTION | RESPIRATORY_TRACT | Status: DC | PRN

## 2019-12-21 MED ORDER — POTASSIUM CHLORIDE 10 MEQ/100ML IV SOLN
10.0000 meq | INTRAVENOUS | Status: AC
  Administered 2019-12-21 (×2): 10 meq via INTRAVENOUS
  Filled 2019-12-21 (×3): qty 100

## 2019-12-21 MED ORDER — POLYETHYLENE GLYCOL 3350 17 G PO PACK: 17.0000 g | PACK | Freq: Every day | ORAL | Status: DC | PRN

## 2019-12-21 MED ORDER — TRAZODONE HCL 50 MG PO TABS: 50.0000 mg | ORAL_TABLET | Freq: Every evening | ORAL | Status: DC | PRN

## 2019-12-21 MED ORDER — MORPHINE SULFATE (PF) 2 MG/ML IV SOLN
2.0000 mg | INTRAVENOUS | Status: DC | PRN
  Administered 2019-12-21 (×2): 2 mg via INTRAVENOUS
  Filled 2019-12-21 (×2): qty 1

## 2019-12-21 MED ORDER — GLUCAGON HCL RDNA (DIAGNOSTIC) 1 MG IJ SOLR
1.0000 mg | Freq: Once | INTRAMUSCULAR | Status: AC
  Administered 2019-12-21: 1 mg via INTRAVENOUS
  Filled 2019-12-21: qty 1

## 2019-12-21 MED ORDER — POTASSIUM CHLORIDE 10 MEQ/100ML IV SOLN
10.0000 meq | INTRAVENOUS | Status: AC
  Administered 2019-12-21 (×6): 10 meq via INTRAVENOUS
  Filled 2019-12-21 (×5): qty 100

## 2019-12-21 MED ORDER — ONDANSETRON HCL 4 MG PO TABS: 4.0000 mg | ORAL_TABLET | Freq: Four times a day (QID) | ORAL | Status: DC | PRN

## 2019-12-21 MED ORDER — SODIUM CHLORIDE 0.9 % IV SOLN: 250.0000 mL | INTRAVENOUS | Status: DC | PRN

## 2019-12-21 MED ORDER — ONDANSETRON HCL 4 MG/2ML IJ SOLN: 4.0000 mg | Freq: Four times a day (QID) | INTRAMUSCULAR | Status: DC | PRN

## 2019-12-21 MED ORDER — POTASSIUM CHLORIDE 10 MEQ/100ML IV SOLN
10.0000 meq | INTRAVENOUS | Status: DC
  Filled 2019-12-21: qty 100

## 2019-12-21 MED ORDER — MAGNESIUM SULFATE 2 GM/50ML IV SOLN: 2.0000 g | Freq: Once | INTRAVENOUS | Status: DC

## 2019-12-21 MED ORDER — METOPROLOL TARTRATE 5 MG/5ML IV SOLN: 2.5000 mg | Freq: Three times a day (TID) | INTRAVENOUS | Status: DC

## 2019-12-21 MED ORDER — POTASSIUM CHLORIDE 10 MEQ/100ML IV SOLN: 10.0000 meq | INTRAVENOUS | Status: DC

## 2019-12-21 MED ORDER — ACETAMINOPHEN 325 MG PO TABS
650.0000 mg | ORAL_TABLET | Freq: Four times a day (QID) | ORAL | Status: DC | PRN
  Administered 2019-12-23 – 2019-12-24 (×3): 650 mg via ORAL
  Filled 2019-12-21 (×3): qty 2

## 2019-12-21 MED ORDER — MAGNESIUM SULFATE 4 GM/100ML IV SOLN
4.0000 g | Freq: Once | INTRAVENOUS | Status: AC
  Administered 2019-12-21: 4 g via INTRAVENOUS
  Filled 2019-12-21: qty 100

## 2019-12-21 MED ORDER — PANTOPRAZOLE SODIUM 40 MG IV SOLR
40.0000 mg | INTRAVENOUS | Status: DC
  Administered 2019-12-21 – 2019-12-23 (×3): 40 mg via INTRAVENOUS
  Filled 2019-12-21 (×4): qty 40

## 2019-12-21 MED ORDER — PHENOL 1.4 % MT LIQD
1.0000 | OROMUCOSAL | Status: DC | PRN
  Administered 2019-12-21: 14:00:00 1 via OROMUCOSAL
  Filled 2019-12-21: qty 177

## 2019-12-21 MED ORDER — SODIUM CHLORIDE 0.9% FLUSH
3.0000 mL | INTRAVENOUS | Status: DC | PRN
  Administered 2019-12-22: 3 mL via INTRAVENOUS

## 2019-12-21 MED ORDER — SODIUM CHLORIDE 0.9% FLUSH
3.0000 mL | Freq: Two times a day (BID) | INTRAVENOUS | Status: DC
  Administered 2019-12-22 – 2019-12-23 (×3): 3 mL via INTRAVENOUS

## 2019-12-21 MED ORDER — SODIUM CHLORIDE 0.9 % IV SOLN: INTRAVENOUS | Status: DC

## 2019-12-21 NOTE — Progress Notes (Signed)
CRITICAL VALUE ALERT  Critical Value: Na 112, K+ 2.9  Date & Time Notied:  12/21/19 1409  Provider Notified: Text paged Dr. Joesph Fillers  Orders Received/Actions taken: No new orders needed at this time.

## 2019-12-21 NOTE — Consult Note (Addendum)
Cardiology Consult    Patient ID: Genasis Barszcz; [id number]; December 13, 1925   Admit date: 12/21/2019 Date of Consult: 12/21/2019  Primary Care Provider: Iona Beard, MD Primary Cardiologist: New to [facility] - Dr. Bronson Ing  Patient Profile    [name] is a 86 y.o. female with past medical history of HTN, HLD, prior espophageal stricture, dementia and remote history of PE (occurring in 2005) who is being seen today for the evaluation of new-onset atrial fibrillation and clearance for EGD at the request of Neil Crouch, PA-C and Dr. Gala Romney.   History of Present Illness    [name] presented to [facility] ED during the early morning hours of 12/21/2019 for evaluation of difficulty swallowing. [name] reported consuming food yesterday and felt like something was stuck in her throat and was unable to swallow. In talking with the patient and her son today, most history is provided by her son as he has been her primary caregiver for over 13 years. He reports [name] had been in her usual state of health until yesterday when [name] became choked while consuming food. [name] has appeared more anxious since this occurred and is asking for something to drink due to her throat feeling dry. [name] is not overly active at baseline and ambulates with a cane around the house. They have mostly been homebound given COVID-19. No recent reports of chest pain, changes in dyspnea, palpitations, orthopnea or lower extremity edema.  The patient and her son are unaware of any known history of CAD or cardiac arrhythmias. [name] was never told in the past that [name] had atrial fibrillation. [name] was on Toprol-XL 50 mg daily as an outpatient with her last dose being yesterday morning.  Initial labs showed WBC 5.0, Hgb 11.2, platelets 215, Na+ 111, K+ 2.3, iron 74, glucose 245 and creatinine 0.53. Mg 1.0. EKG showed a computer-generated read of atrial fibrillation, HR 109 and while the rhythm is irregular, there is significant baseline artifact  and is difficult to determine any P-waves.  [name] has been started on electrolyte replacement with 4mg  of Magnesium and IV K+ at 10 mEq x 4 given her NPO status.    Past Medical History:  Diagnosis Date  . Anxiety   . Dementia, senile, with, delirium 10/06/2011  . Depression   . Diverticulosis    Colon   . Hepatic hemangioma   . Hx pulmonary embolism 2005  . Hyperglycemia 10/06/2011  . Hyperlipidemia   . Hypertension   . Normocytic anemia   . Osteopenia 10/08/2003   Dx   . Polypharmacy     Past Surgical History:  Procedure Laterality Date  . ABDOMINAL HYSTERECTOMY    . BALLOON DILATION N/A 04/21/2016   Procedure: BALLOON DILATION;  Surgeon: Wonda Horner, MD;  Location: [facility];  Service: Endoscopy;  Laterality: N/A;  . ESOPHAGOGASTRODUODENOSCOPY (EGD) WITH PROPOFOL N/A 04/21/2016   Procedure: ESOPHAGOGASTRODUODENOSCOPY (EGD) WITH PROPOFOL;  Surgeon: Wonda Horner, MD;  Location: [facility];  Service: Endoscopy;  Laterality: N/A;  . fibroid tumor on shoulder    . Rt arm /shoulder surgery    . Rt Closed Reduction Percutaneous Pins Ext Fixator  08/26/2007   Dr. Aline Brochure  . vaginal  delivery     x10     Home Medications:  Prior to Admission medications   Medication Sig Start Date End Date Taking? Authorizing Provider  ALPRAZolam Duanne Moron) 0.5 MG tablet TAKE (1) TABLET BY MOUTH AT BEDTIME. 10/16/19   Carole Civil, MD  hydrochlorothiazide (  HYDRODIURIL) 25 MG tablet  02/14/18   [provider]  HYDROcodone-acetaminophen (NORCO/VICODIN) 5-325 MG tablet TAKE 1 TABLET BY MOUTH EVERY 8 HOURS AS NEEDED FOR MODERATE PAIN. 11/22/19   Carole Civil, MD  memantine [facility]) 10 MG tablet  08/18/19   [provider]  memantine (NAMENDA) 5 MG tablet Take 5 mg by mouth 2 (two) times daily.    [provider]  Menthol (ICY HOT) 5 % PTCH Place 1 patch onto the skin every 8 (eight) hours as needed (For pain.).    [provider]  metoprolol  succinate (TOPROL-XL) 50 MG 24 hr tablet Take 50 mg by mouth daily. 04/01/16   [provider]  omeprazole (PRILOSEC) 20 MG capsule  01/19/18   [provider]    Inpatient Medications: Scheduled Meds: . glucagon (human recombinant)  1 mg Intravenous Once  . pantoprazole (PROTONIX) IV  40 mg Intravenous Q24H   Continuous Infusions: . sodium chloride 150 mL/hr at 12/21/19 0821  . magnesium sulfate bolus IVPB    . potassium chloride Stopped (12/21/19 0848)  . potassium chloride    . sodium chloride Stopped (12/21/19 [id number])   PRN Meds:   Allergies:    Allergies  Allergen Reactions  . Erythromycin Other (See Comments)    Reaction unknown    Social History:   Social History   Socioeconomic History  . Marital status: Widowed    Spouse name: Not on file  . Number of children: Not on file  . Years of education: Not on file  . Highest education level: Not on file  Occupational History  . Not on file  Tobacco Use  . Smoking status: Never Smoker  . Smokeless tobacco: Never Used  Substance and Sexual Activity  . Alcohol use: Yes    Comment: wine rarely  . Drug use: No  . Sexual activity: Not on file  Other Topics Concern  . Not on file  Social History Narrative   Lives with her son.    Social Determinants of Health   Financial Resource Strain:   . Difficulty of Paying Living Expenses: Not on file  Food Insecurity:   . Worried About Charity fundraiser in the Last Year: Not on file  . Ran Out of Food in the Last Year: Not on file  Transportation Needs:   . Lack of Transportation (Medical): Not on file  . Lack of Transportation (Non-Medical): Not on file  Physical Activity:   . Days of Exercise per Week: Not on file  . Minutes of Exercise per Session: Not on file  Stress:   . Feeling of Stress : Not on file  Social Connections:   . Frequency of Communication with Friends and Family: Not on file  . Frequency of Social Gatherings with Friends and  Family: Not on file  . Attends Religious Services: Not on file  . Active Member of Clubs or Organizations: Not on file  . Attends Archivist Meetings: Not on file  . Marital Status: Not on file  Intimate Partner Violence:   . Fear of Current or Ex-Partner: Not on file  . Emotionally Abused: Not on file  . Physically Abused: Not on file  . Sexually Abused: Not on file     Family History:    Family History  Problem Relation Age of Onset  . Diabetes Other        family history   . Cancer Other  family history   . Diabetes Father   . Cancer Sister        Breast  . Cancer Brother        Lung      Review of Systems    General:  No chills, fever, night sweats or weight changes.  Cardiovascular:  No chest pain, dyspnea on exertion, edema, orthopnea, palpitations, paroxysmal nocturnal dyspnea. Dermatological: No rash, lesions/masses Respiratory: No cough, dyspnea Urologic: No hematuria, dysuria Abdominal:   No nausea, vomiting, diarrhea, bright red blood per rectum, melena, or hematemesis. Positive for difficulty swallowing.  Neurologic:  No visual changes, wkns, changes in mental status. All other systems reviewed and are otherwise negative except as noted above.  Physical Exam/Data    Vitals:   12/21/19 0516 12/21/19 0517 12/21/19 0518 12/21/19 0730  BP: (!) 152/82   (!) 122/91  Pulse: 88 98 88 97  Resp: 16   20  Temp: 98.5 F (36.9 C)     TempSrc: Oral     SpO2: 100% 98% 100% 100%  Weight:  62.6 kg    Height:  5\' 7"  (1.702 m)     No intake or output data in the 24 hours ending 12/21/19 0948 Filed Weights   12/21/19 0517  Weight: 62.6 kg   Body mass index is 21.61 kg/m.   General: Pleasant, elderly female. Appears anxious. Psych: Normal affect. Neuro: Alert and oriented X 3. Moves all extremities spontaneously. HEENT: Normal  Neck: Supple without bruits or JVD. Lungs:  Resp regular and unlabored, CTA without wheezing or rales. Heart:  Irregularly irregular no s3, s4, or murmurs. Abdomen: Soft, non-tender, non-distended, BS + x 4.  Extremities: No clubbing, cyanosis or lower extremity edema. DP/PT/Radials 2+ and equal bilaterally.   EKG:  The EKG was personally reviewed and demonstrates: Computer-generated read of atrial fibrillation, HR 109 and while the rhythm is irregular, there is significant baseline artifact and is difficult to determine any P-waves.  Telemetry:  Telemetry was personally reviewed and demonstrates: Not currently connected. Will order.    Labs/Studies     Relevant CV Studies:  Echocardiogram: Pending  Laboratory Data:  Chemistry Recent Labs  Lab 12/21/19 0609  NA 111*  K 2.3*  CL 74*  CO2 20*  GLUCOSE 245*  BUN 7*  CREATININE 0.53  CALCIUM 8.1*  GFRNONAA >60  GFRAA >60  ANIONGAP 17*    No results for input(s): PROT, ALBUMIN, AST, ALT, ALKPHOS, BILITOT in the last 168 hours. Hematology Recent Labs  Lab 12/21/19 0609  WBC 5.0  RBC 3.36*  HGB 11.2*  HCT 31.1*  MCV 92.6  MCH 33.3  MCHC 36.0  RDW 11.7  PLT 215   Cardiac EnzymesNo results for input(s): TROPONINI in the last 168 hours. No results for input(s): TROPIPOC in the last 168 hours.  BNPNo results for input(s): BNP, PROBNP in the last 168 hours.  DDimer No results for input(s): DDIMER in the last 168 hours.  Radiology/Studies:  No results found.   Assessment & Plan    1. New-onset Atrial Fibrillation - Unknown duration as [name] has been asymptomatic with the arrhythmia and the patient's son reports they had never been told [name] has atrial fibrillation. This could be new onset in the setting of her electrolyte derangements. - Rates have been in the 80's to 90's when checked by nursing staff but on examination, her rate does seem slightly elevated. [name] was on Toprol-XL 50 mg daily prior to admission but is currently NPO. Will  order IV Lopressor 2.5 mg every 8 hours which can be titrated if needed. Hold parameters in  place. Aim to keep K+ ~ 4.0 and Mg ~ 2.0. Will order TSH. Echocardiogram pending to assess LV function and wall motion.  - This patients CHA2DS2-VASc Score and unadjusted Ischemic Stroke Rate (% per year) is equal to 9.7 % stroke rate/year from a score of 6 (HTN, Female, Age (2), history of PE (2)).  Discussed anticoagulation with the patient's family. Consider initiation of this following GI work-up and if [name] remains in atrial fibrillation despite normalization of her electrolytes.  2. Preoperative Clearance for EGD - The procedure itself is overall low-risk from a cardiac perspective. RCRI Risk at 0.4% risk of a major cardiac event. Agree with replacing electrolytes prior to the procedure. Will also start IV Lopressor for rate control as [name] was on Toprol-XL prior to admission.  3. HTN - BP stable at 122/82 - 152/98 within the past 24 hours. PTA HCTZ and Toprol-XL currently held given NPO status. Start IV Lopressor as outlined above.   4. Esophageal Obstruction - GI following and planning for EGD once electrolyte imbalances improve.    For questions or updates, please contact [facility] Please consult [email or web address] for contact info under Cardiology/STEMI.  Signed, Erma Heritage, PA-C 12/21/2019, 9:48 AM Pager: [phone]   The patient was seen and examined, and I agree with the history, physical exam, assessment and plan as documented above, with modifications as noted below. I have also personally reviewed all relevant documentation, old records, labs, and both radiographic and cardiovascular studies. I have also independently interpreted old and new ECG's.  Briefly, this is a 86 year old woman with a history of hypertension, hyperlipidemia, Alzheimer's dementia, remote history of pulmonary embolism, and prior esophageal stricture.  I spoke with her son, [name], who has been taking care of her for the past 13 years.  The patient's nurse was also present at the time my  evaluation and [name] had just ministered 2.5 mg of IV metoprolol his heart rate got up to the 180 bpm range.  [name] presented to the ED early this morning with complaints of difficulty swallowing.  [name] denies any palpitations, chest pain, shortness of breath.  [name] was never told [name] had a cardiac arrhythmia in the past.  [name] takes Toprol-XL 50 mg daily in the outpatient setting.  [name] had multiple electrolyte abnormalities including markedly low potassium of 2.3 and magnesium of 1.  I personally reviewed the ECG which demonstrated an irregular rhythm with a lot of baseline artifact, heart rate 109 bpm.  Electrolyte replacement has been initiated.  Echocardiogram was just performed which I will review.  I spoke to her son about potential systemic anticoagulation for thromboembolic risk reduction once her procedure is completed.  [name] is at low risk for major adverse cardiac events given the low risk nature of planned procedure (EGD).   Kate Sable, MD, [facility]  12/21/2019 11:20 AM

## 2019-12-21 NOTE — Progress Notes (Signed)
EKG ordered, notified Dr. Denton Brick of EKG results once completed. Also notified of patient having period of tachycardia, HR remains 80s-90s at this time. No new orders at this time.

## 2019-12-21 NOTE — H&P (Signed)
Patient Demographics:    [name], is a 86 y.o. female  MRN: [id number]   DOB - December 12, 1925  Admit Date - 12/21/2019  Outpatient Primary MD for the patient is Iona Beard, MD   Assessment & Plan:    Principal Problem:   Food impaction of esophagus Active Problems:   Hyponatremia   New onset atrial fibrillation (HCC)   Hypokalemia   Hypomagnesemia   PULMONARY EMBOLISM, HX OF   Dementia (HCC)   Hyperglycemia   Encounter for anticoagulation discussion and counseling   Essential hypertension   Preoperative clearance   1) esophageal food impaction--- patient with prior history of esophageal stricture and food impaction, requiring esophageal dilatation in the past  ---remains symptomatic after glucagon administration -N.p.o.  -GI consult appreciated, plan is for EGD with disimpaction pending further stabilization of cardiovascular electrolyte derangements -IV Protonix as ordered  2) new onset of A. fib with RVR---- in the setting of significant electrolyte derangements, CHA2DS2- VASc score   is = 6   Which is  equal to = 9.7  % annual risk of stroke  -Hold off on anticoagulation pending GI procedure -PTA was on Toprol-XL -May use IV metoprolol with parameters -Echo with preserved EF of 65 to 70% -TSH pending -Continue to replace electrolytes  3) severe electrolyte abnormalities--suspect due to HCTZ use and poor oral intake  -hypokalemia with a potassium of 2.3, hypomagnesemia with a magnesium of 1.0, and severe hyponatremia with a sodium of 111 --Given for the impaction and inability to take oral intake--- replace potassium and magnesium  IV -IV normal saline for sodium replacement -No significant neuro symptoms at this time -Serial BMP and adjust IV fluid rate and consistently per lab data -Avoid  over aggressive correction of sodium -Patient sodium was 130 on 04/04/2018 -Stop HCTZ  4) preop clearance--- cardiology consult for cardiac arrest noted -Echo noted as above   5) hyperglycemia--- blood glucose over 200, allow some permissive Hyperglycemia rather than risk life-threatening hypoglycemia in a patient with unreliable oral intake.-Check A1c  6) social/ethics--- patient is a full code, she has underlying dementia, patient son is primary caregiver at home   With History of -  Reviewed by me  Past Medical History:  Diagnosis Date  . Anxiety   . Dementia, senile, with, delirium 10/06/2011  . Depression   . Diverticulosis    Colon   . Hepatic hemangioma   . Hx pulmonary embolism 2005  . Hyperglycemia 10/06/2011  . Hyperlipidemia   . Hypertension   . Normocytic anemia   . Osteopenia 10/08/2003   Dx   . Polypharmacy       Past Surgical History:  Procedure Laterality Date  . ABDOMINAL HYSTERECTOMY    . BALLOON DILATION N/A 04/21/2016   Procedure: BALLOON DILATION;  Surgeon: Wonda Horner, MD;  Location: [facility];  Service: Endoscopy;  Laterality: N/A;  . ESOPHAGOGASTRODUODENOSCOPY (EGD) WITH PROPOFOL N/A 04/21/2016   Procedure: ESOPHAGOGASTRODUODENOSCOPY (  EGD) WITH PROPOFOL;  Surgeon: Wonda Horner, MD;  Location: [facility];  Service: Endoscopy;  Laterality: N/A;  . fibroid tumor on shoulder    . Rt arm /shoulder surgery    . Rt Closed Reduction Percutaneous Pins Ext Fixator  08/26/2007   Dr. Aline Brochure  . vaginal  delivery     x10      Chief Complaint  Patient presents with  . Dysphagia      HPI:    [name]  is a 86 y.o. female with PMHx of  HTN, HLD, prior espophageal stricture previously requiring EGD with dilatation,, dementia and remote history of PE (occurring in 2005) who presents with intractable emesis and inability to swallow since 12/20/2019 -Patient apparently atypical, felt choked, has been having difficulty swallowing since then with  dry heaves and emesis noted when able to swallow saliva -- In the ED glucagon administered with no relief -Additional history obtained from patient's son at bedside, patient has underlying dementia with cognitive deficits and she is a poor historian -In ED patient found to be tachycardic with EKG suggesting A. fib with RVR -Labs revealed magnesium of 1.0, potassium of 2.3 and sodium of 111 -GI consult requested by EDP -Cardiology consult requested by anesthesia and GI service   Review of systems:    In addition to the HPI above,   A full Review of  Systems was done, all other systems reviewed are negative except as noted above in HPI , .    Social History:  Reviewed by me    Social History   Tobacco Use  . Smoking status: Never Smoker  . Smokeless tobacco: Never Used  Substance Use Topics  . Alcohol use: Yes    Comment: wine rarely       Family History :  Reviewed by me    Family History  Problem Relation Age of Onset  . Diabetes Other        family history   . Cancer Other        family history   . Diabetes Father   . Cancer Sister        Breast  . Cancer Brother        Lung     Home Medications:   Prior to Admission medications   Medication Sig Start Date End Date Taking? Authorizing Provider  acetaminophen (TYLENOL) 500 MG tablet Take 500 mg by mouth every 6 (six) hours as needed.   Yes [provider]  hydrochlorothiazide (HYDRODIURIL) 25 MG tablet Take 25 mg by mouth daily.  02/14/18  Yes [provider]  HYDROcodone-acetaminophen (NORCO/VICODIN) 5-325 MG tablet TAKE 1 TABLET BY MOUTH EVERY 8 HOURS AS NEEDED FOR MODERATE PAIN. 11/22/19  Yes Carole Civil, MD  memantine (NAMENDA) 10 MG tablet Take 10 mg by mouth daily.  08/18/19  Yes [provider]  metoprolol succinate (TOPROL-XL) 50 MG 24 hr tablet Take 50 mg by mouth daily. 04/01/16  Yes [provider]  omeprazole (PRILOSEC) 20 MG capsule Take 20 mg by mouth daily.   01/19/18  Yes [provider]  XANAX 0.25 MG tablet Take 0.25 mg by mouth daily as needed. 12/19/19  Yes [provider]     Allergies:     Allergies  Allergen Reactions  . Erythromycin Other (See Comments)    Reaction unknown     Physical Exam:   Vitals  Blood pressure (!) 112/91, pulse 85, temperature 98.4 F (36.9 C), temperature source Oral,  resp. rate 16, height 5\' 7"  (1.702 m), weight 59.5 kg, SpO2 100 %.  Physical Examination: General appearance - alert, well appearing, and in no distress  Mental status - alert, oriented to person, some cognitive deficits,  Eyes - sclera anicteric Neck - supple, no JVD elevation , Chest - clear  to auscultation bilaterally, symmetrical air movement,  Heart - S1 and S2 normal, regular  Abdomen - soft, nontender, nondistended, no masses or organomegaly Neurological - s generalized weakness without new focal deficits  extremities - no pedal edema noted, intact peripheral pulses  Skin - warm, dry     Data Review:    CBC Recent Labs  Lab 12/21/19 0609  WBC 5.0  HGB 11.2*  HCT 31.1*  PLT 215  MCV 92.6  MCH 33.3  MCHC 36.0  RDW 11.7   ------------------------------------------------------------------------------------------------------------------  Chemistries  Recent Labs  Lab 12/21/19 0609 12/21/19 1204 12/21/19 1740  NA 111* 112* 112*  K 2.3* 2.9* 3.5  CL 74* 80* 81*  CO2 20* 21* 21*  GLUCOSE 245* 205* 139*  BUN 7* 7* 8  CREATININE 0.53 0.45 0.46  CALCIUM 8.1* 7.7* 7.4*  MG 1.0*  --  2.4   ------------------------------------------------------------------------------------------------------------------ estimated creatinine clearance is 43 mL/min (by C-G formula based on SCr of 0.46 mg/dL). ------------------------------------------------------------------------------------------------------------------ No results for input(s): TSH, T4TOTAL, T3FREE, THYROIDAB in the last 72 hours.  Invalid  input(s): FREET3   Coagulation profile No results for input(s): INR, PROTIME in the last 168 hours. ------------------------------------------------------------------------------------------------------------------- No results for input(s): DDIMER in the last 72 hours. -------------------------------------------------------------------------------------------------------------------  Cardiac Enzymes No results for input(s): CKMB, TROPONINI, MYOGLOBIN in the last 168 hours.  Invalid input(s): CK ------------------------------------------------------------------------------------------------------------------ No results found for: BNP   ---------------------------------------------------------------------------------------------------------------  Urinalysis    Component Value Date/Time   COLORURINE YELLOW 10/05/2011 0426   APPEARANCEUR CLEAR 10/05/2011 0426   LABSPEC <1.005 (L) 10/05/2011 0426   PHURINE 6.5 10/05/2011 0426   GLUCOSEU NEGATIVE 10/05/2011 0426   HGBUR TRACE (A) 10/05/2011 0426   BILIRUBINUR NEGATIVE 10/05/2011 0426   KETONESUR NEGATIVE 10/05/2011 0426   PROTEINUR NEGATIVE 10/05/2011 0426   UROBILINOGEN 0.2 10/05/2011 0426   NITRITE NEGATIVE 10/05/2011 0426   LEUKOCYTESUR NEGATIVE 10/05/2011 0426    ----------------------------------------------------------------------------------------------------------------   Imaging Results:    ECHOCARDIOGRAM COMPLETE  Result Date: 12/21/2019   ECHOCARDIOGRAM REPORT   Patient Name:   [name] Date of Exam: 12/21/2019 Medical Rec #:  [id number]      Height:       67.0 in Accession #:    [id number]     Weight:       131.2 lb Date of Birth:  1926-04-03      BSA:          1.69 m Patient Age:    10 years       BP:           128/98 mmHg Patient Gender: F              HR:           108 bpm. Exam Location:  [facility] Procedure: 2D Echo Indications:    Atrial Fibrillation 427.31 / I48.91  History:        Patient has no prior  history of Echocardiogram examinations.                 Arrythmias:Atrial Fibrillation; Risk Factors:Dyslipidemia,                 Hypertension and  Non-Smoker. Food impaction of esophagus,                 PULMONARY EMBOLISM, HX OF, ARF (acute renal failure.  Sonographer:    Leavy Cella RDCS (AE) Referring Phys: [id number] [place]  1. Left ventricular ejection fraction, by visual estimation, is 65 to 70%. The left ventricle has hyperdynamic function. There is mildly increased left ventricular hypertrophy.  2. Left ventricular diastolic function could not be evaluated.  3. The left ventricle has no regional wall motion abnormalities.  4. Global right ventricle has normal systolic function.The right ventricular size is normal. No increase in right ventricular wall thickness.  5. Left atrial size was normal.  6. Right atrial size was normal.  7. The mitral valve is grossly normal. No evidence of mitral valve regurgitation.  8. The tricuspid valve is grossly normal.  9. The tricuspid valve is grossly normal. Tricuspid valve regurgitation is trivial. 10. The aortic valve is tricuspid. Aortic valve regurgitation is not visualized. 11. The pulmonic valve was grossly normal. Pulmonic valve regurgitation is not visualized. 12. The inferior vena cava is normal in size with greater than 50% respiratory variability, suggesting right atrial pressure of 3 mmHg. FINDINGS  Left Ventricle: Left ventricular ejection fraction, by visual estimation, is 65 to 70%. The left ventricle has hyperdynamic function. The left ventricle has no regional wall motion abnormalities. The left ventricular internal cavity size was the left ventricle is normal in size. There is mildly increased left ventricular hypertrophy. Concentric left ventricular hypertrophy. The left ventricular diastology could not be evaluated due to atrial fibrillation. Left ventricular diastolic function could not  be evaluated. Right Ventricle: The  right ventricular size is normal. No increase in right ventricular wall thickness. Global RV systolic function is has normal systolic function. Left Atrium: Left atrial size was normal in size. Right Atrium: Right atrial size was normal in size Pericardium: There is no evidence of pericardial effusion. Mitral Valve: The mitral valve is grossly normal. No evidence of mitral valve regurgitation. Tricuspid Valve: The tricuspid valve is grossly normal. Tricuspid valve regurgitation is trivial. Aortic Valve: The aortic valve is tricuspid. Aortic valve regurgitation is not visualized. Pulmonic Valve: The pulmonic valve was grossly normal. Pulmonic valve regurgitation is not visualized. Pulmonic regurgitation is not visualized. Aorta: The aortic root is normal in size and structure. Venous: The inferior vena cava is normal in size with greater than 50% respiratory variability, suggesting right atrial pressure of 3 mmHg. IAS/Shunts: No atrial level shunt detected by color flow Doppler.  LEFT VENTRICLE PLAX 2D LVIDd:         3.58 cm  Diastology LVIDs:         1.66 cm  LV e' lateral:   7.30 cm/s LV PW:         1.04 cm  LV E/e' lateral: 9.8 LV IVS:        1.18 cm  LV e' medial:    9.09 cm/s LVOT diam:     2.00 cm  LV E/e' medial:  7.9 LV SV:         46 ml LV SV Index:   27.38 LVOT Area:     3.14 cm  RIGHT VENTRICLE RV S prime:     17.90 cm/s TAPSE (M-mode): 2.3 cm LEFT ATRIUM             Index       RIGHT ATRIUM  Index LA diam:        2.90 cm 1.72 cm/m  RA Area:     13.00 cm LA Vol (A2C):   22.2 ml 13.13 ml/m RA Volume:   27.00 ml  15.97 ml/m LA Vol (A4C):   32.9 ml 19.46 ml/m LA Biplane Vol: 29.6 ml 17.51 ml/m   AORTA Ao Root diam: 2.80 cm MITRAL VALVE MV Area (PHT): 2.48 cm              SHUNTS MV PHT:        88.74 msec            Systemic Diam: 2.00 cm MV Decel Time: 306 msec MV E velocity: 71.60 cm/s  103 cm/s MV A velocity: 103.00 cm/s 70.3 cm/s MV E/A ratio:  0.70        1.5  Kate Sable MD  Electronically signed by Kate Sable MD Signature Date/Time: 12/21/2019/11:35:10 AM    Final     Radiological Exams on Admission: ECHOCARDIOGRAM COMPLETE  Result Date: 12/21/2019   ECHOCARDIOGRAM REPORT   Patient Name:   [name] Date of Exam: 12/21/2019 Medical Rec #:  [id number]      Height:       67.0 in Accession #:    [id number]     Weight:       131.2 lb Date of Birth:  1926-10-11      BSA:          1.69 m Patient Age:    19 years       BP:           128/98 mmHg Patient Gender: F              HR:           108 bpm. Exam Location:  [facility] Procedure: 2D Echo Indications:    Atrial Fibrillation 427.31 / I48.91  History:        Patient has no prior history of Echocardiogram examinations.                 Arrythmias:Atrial Fibrillation; Risk Factors:Dyslipidemia,                 Hypertension and Non-Smoker. Food impaction of esophagus,                 PULMONARY EMBOLISM, HX OF, ARF (acute renal failure.  Sonographer:    Leavy Cella RDCS (AE) Referring Phys: [id number] [place]  1. Left ventricular ejection fraction, by visual estimation, is 65 to 70%. The left ventricle has hyperdynamic function. There is mildly increased left ventricular hypertrophy.  2. Left ventricular diastolic function could not be evaluated.  3. The left ventricle has no regional wall motion abnormalities.  4. Global right ventricle has normal systolic function.The right ventricular size is normal. No increase in right ventricular wall thickness.  5. Left atrial size was normal.  6. Right atrial size was normal.  7. The mitral valve is grossly normal. No evidence of mitral valve regurgitation.  8. The tricuspid valve is grossly normal.  9. The tricuspid valve is grossly normal. Tricuspid valve regurgitation is trivial. 10. The aortic valve is tricuspid. Aortic valve regurgitation is not visualized. 11. The pulmonic valve was grossly normal. Pulmonic valve regurgitation is not visualized. 12. The inferior  vena cava is normal in size with greater than 50% respiratory variability, suggesting right atrial pressure of 3 mmHg. FINDINGS  Left Ventricle: Left ventricular ejection fraction, by  visual estimation, is 65 to 70%. The left ventricle has hyperdynamic function. The left ventricle has no regional wall motion abnormalities. The left ventricular internal cavity size was the left ventricle is normal in size. There is mildly increased left ventricular hypertrophy. Concentric left ventricular hypertrophy. The left ventricular diastology could not be evaluated due to atrial fibrillation. Left ventricular diastolic function could not  be evaluated. Right Ventricle: The right ventricular size is normal. No increase in right ventricular wall thickness. Global RV systolic function is has normal systolic function. Left Atrium: Left atrial size was normal in size. Right Atrium: Right atrial size was normal in size Pericardium: There is no evidence of pericardial effusion. Mitral Valve: The mitral valve is grossly normal. No evidence of mitral valve regurgitation. Tricuspid Valve: The tricuspid valve is grossly normal. Tricuspid valve regurgitation is trivial. Aortic Valve: The aortic valve is tricuspid. Aortic valve regurgitation is not visualized. Pulmonic Valve: The pulmonic valve was grossly normal. Pulmonic valve regurgitation is not visualized. Pulmonic regurgitation is not visualized. Aorta: The aortic root is normal in size and structure. Venous: The inferior vena cava is normal in size with greater than 50% respiratory variability, suggesting right atrial pressure of 3 mmHg. IAS/Shunts: No atrial level shunt detected by color flow Doppler.  LEFT VENTRICLE PLAX 2D LVIDd:         3.58 cm  Diastology LVIDs:         1.66 cm  LV e' lateral:   7.30 cm/s LV PW:         1.04 cm  LV E/e' lateral: 9.8 LV IVS:        1.18 cm  LV e' medial:    9.09 cm/s LVOT diam:     2.00 cm  LV E/e' medial:  7.9 LV SV:         46 ml LV SV Index:    27.38 LVOT Area:     3.14 cm  RIGHT VENTRICLE RV S prime:     17.90 cm/s TAPSE (M-mode): 2.3 cm LEFT ATRIUM             Index       RIGHT ATRIUM           Index LA diam:        2.90 cm 1.72 cm/m  RA Area:     13.00 cm LA Vol (A2C):   22.2 ml 13.13 ml/m RA Volume:   27.00 ml  15.97 ml/m LA Vol (A4C):   32.9 ml 19.46 ml/m LA Biplane Vol: 29.6 ml 17.51 ml/m   AORTA Ao Root diam: 2.80 cm MITRAL VALVE MV Area (PHT): 2.48 cm              SHUNTS MV PHT:        88.74 msec            Systemic Diam: 2.00 cm MV Decel Time: 306 msec MV E velocity: 71.60 cm/s  103 cm/s MV A velocity: 103.00 cm/s 70.3 cm/s MV E/A ratio:  0.70        1.5  Kate Sable MD Electronically signed by Kate Sable MD Signature Date/Time: 12/21/2019/11:35:10 AM    Final     DVT Prophylaxis -SCD  AM Labs Ordered, also please review Full Orders  Family Communication: Admission, patients condition and plan of care including tests being ordered have been discussed with the patient and son at bedside who indicate understanding and agree with the plan   Code Status - Full Code  Likely DC to home with son after resolution of food impaction and electrolyte abnormalities  Condition   stable Roxan Hockey M.D on 12/21/2019 at 8:14 PM Go to [email or web address] -  for contact info  [facility] - Office  [phone]

## 2019-12-21 NOTE — Progress Notes (Signed)
*  PRELIMINARY RESULTS* Echocardiogram 2D Echocardiogram has been performed.  Leavy Cella 12/21/2019, 11:17 AM

## 2019-12-21 NOTE — Progress Notes (Signed)
Patient noted to be in afib. Echo being performed, RN at bedside administerting meds. HR noted to be up to 180 briefly, quickly returned to HR in the 110s-120 range. Patient asymptomatic. Lopressor 2.5 mg IV given as ordered. Discussed with Dr. Jacinta Shoe at bedside for rounds. Stated okay. HR noted to be in the 80s-90s after receiving Lopressor. Nursing to monitor.

## 2019-12-21 NOTE — Progress Notes (Signed)
Orders noted for KCL x 3 runs from ED physician and KCL x 4 runs from admitting MD. Patient received 1 run of KCL in ED per report. Clarified with Dr. Denton Brick, stated patient should receive 6 more runs of KCL for total of the 7 runs ordered. Discussed with pharmacy to reorder since orders had expired. Patient and son at bedside educated on KCL and magnesium infusions. Verbalized understanding.

## 2019-12-21 NOTE — Consult Note (Addendum)
Referring Provider: Roxan Hockey, MD Primary Care Physician:  Iona Beard, MD Primary Gastroenterologist: Dr. Penelope Coop   Reason for Consultation:  Food obstruction  HPI: [name] is a 86 y.o. female with history of hypertension, history of PE (2005), dementia, esophageal stricture requiring dilation in 2017 presenting via EMS for difficulty swallowing.  Patient reportedly ate a pickle yesterday around 3pm (small sliced piece of pickle) and afterwards felt like it did not go down well. Over the course of yesterday patient was unable to swallow liquids and saliva. She became increasingly uncomfortable so her son called EMS around 4am. In the ED she was given glucagon without any relief.  She was found to have profound hyponatremia and hypokalemia.  Also noted to have A. fib with rate of 102. Since in the ED, she has vomited some of the soda she tried to drink at home.   SARS coronavirus 2 negative.  Hemoglobin 11.2 appears to be at her baseline.  Sodium 111, potassium 2.3, chloride 74, glucose 245, BUN 7, creatinine 0.53.  Magnesium level 1.0.  She requires admission for electrolyte abnormalities.  Patient has had increasing difficultly swallowing over the past few months. Has difficulty with some solid foods and elongated pills. No significant heartburn, abd pain. BMs regular. No melena, brbpr.   EGD June 2017 with Dr. Penelope Coop: One moderate stenosis was found. And was traversed after dilation. A TTS dilator was passed through the scope. Dilation with a 08-27-11 mm balloon dilator was performed to 12 mm.A small hiatal hernia was present. One cratered gastric ulcer was found in the gastric antrum. The lesion was 4 mm in largest dimension.Biopsies were taken with a cold forceps for histology.One cratered duodenal ulcer was found in the duodenal bulb. It appeared large and there was stenosis of the distal bulb.  Gastric biopsy benign with no H. pylori.    Prior to Admission medications    Medication Sig Start Date End Date Taking? Authorizing Provider  ALPRAZolam Duanne Moron) 0.5 MG tablet TAKE (1) TABLET BY MOUTH AT BEDTIME. 10/16/19   Carole Civil, MD  hydrochlorothiazide (HYDRODIURIL) 25 MG tablet  02/14/18   [provider]  HYDROcodone-acetaminophen (NORCO/VICODIN) 5-325 MG tablet TAKE 1 TABLET BY MOUTH EVERY 8 HOURS AS NEEDED FOR MODERATE PAIN. 11/22/19   Carole Civil, MD  memantine [facility]) 10 MG tablet  08/18/19   [provider]  memantine (NAMENDA) 5 MG tablet Take 5 mg by mouth 2 (two) times daily.    [provider]  Menthol (ICY HOT) 5 % PTCH Place 1 patch onto the skin every 8 (eight) hours as needed (For pain.).    [provider]  metoprolol succinate (TOPROL-XL) 50 MG 24 hr tablet Take 50 mg by mouth daily. 04/01/16   [provider]  omeprazole (PRILOSEC) 20 MG capsule  01/19/18   [provider]    Current Facility-Administered Medications  Medication Dose Route Frequency Provider Last Rate Last Admin  . potassium chloride 10 mEq in 100 mL IVPB  10 mEq Intravenous Q1 Hr x 3 Pollina, Gwenyth Allegra, MD 100 mL/hr at 12/21/19 0736 10 mEq at 12/21/19 [id number]   Current Outpatient Medications  Medication Sig Dispense Refill  . ALPRAZolam (XANAX) 0.5 MG tablet TAKE (1) TABLET BY MOUTH AT BEDTIME. 30 tablet 0  . hydrochlorothiazide (HYDRODIURIL) 25 MG tablet     . HYDROcodone-acetaminophen (NORCO/VICODIN) 5-325 MG tablet TAKE 1 TABLET BY MOUTH EVERY 8 HOURS AS NEEDED FOR MODERATE PAIN. 90 tablet 0  .  memantine (NAMENDA) 10 MG tablet     . memantine (NAMENDA) 5 MG tablet Take 5 mg by mouth 2 (two) times daily.    . Menthol (ICY HOT) 5 % PTCH Place 1 patch onto the skin every 8 (eight) hours as needed (For pain.).    Marland Kitchen metoprolol succinate (TOPROL-XL) 50 MG 24 hr tablet Take 50 mg by mouth daily.    Marland Kitchen omeprazole (PRILOSEC) 20 MG capsule       Allergies as of 12/21/2019 - Review Complete 12/21/2019  Allergen  Reaction Noted  . Erythromycin Other (See Comments)     Past Medical History:  Diagnosis Date  . Anxiety   . Dementia, senile, with, delirium 10/06/2011  . Depression   . Diverticulosis    Colon   . Hepatic hemangioma   . Hx pulmonary embolism   . Hyperglycemia 10/06/2011  . Hyperlipidemia   . Hypertension   . Normocytic anemia   . Osteopenia 10/08/2003   Dx   . Polypharmacy     Past Surgical History:  Procedure Laterality Date  . ABDOMINAL HYSTERECTOMY    . BALLOON DILATION N/A 04/21/2016   Procedure: BALLOON DILATION;  Surgeon: Wonda Horner, MD;  Location: [facility];  Service: Endoscopy;  Laterality: N/A;  . ESOPHAGOGASTRODUODENOSCOPY (EGD) WITH PROPOFOL N/A 04/21/2016   Procedure: ESOPHAGOGASTRODUODENOSCOPY (EGD) WITH PROPOFOL;  Surgeon: Wonda Horner, MD;  Location: [facility];  Service: Endoscopy;  Laterality: N/A;  . fibroid tumor on shoulder    . Rt arm /shoulder surgery    . Rt Closed Reduction Percutaneous Pins Ext Fixator  08/26/2007   Dr. Aline Brochure  . vaginal  delivery     x10    Family History  Problem Relation Age of Onset  . Diabetes Other        family history   . Cancer Other        family history   . Diabetes Father   . Cancer Sister        Breast  . Cancer Brother        Lung    Social History   Socioeconomic History  . Marital status: Widowed    Spouse name: Not on file  . Number of children: Not on file  . Years of education: Not on file  . Highest education level: Not on file  Occupational History  . Not on file  Tobacco Use  . Smoking status: Never Smoker  . Smokeless tobacco: Never Used  Substance and Sexual Activity  . Alcohol use: Yes    Comment: wine rarely  . Drug use: No  . Sexual activity: Not on file  Other Topics Concern  . Not on file  Social History Narrative   Lives with her son.    Social Determinants of Health   Financial Resource Strain:   . Difficulty of Paying Living Expenses: Not on file  Food  Insecurity:   . Worried About Charity fundraiser in the Last Year: Not on file  . Ran Out of Food in the Last Year: Not on file  Transportation Needs:   . Lack of Transportation (Medical): Not on file  . Lack of Transportation (Non-Medical): Not on file  Physical Activity:   . Days of Exercise per Week: Not on file  . Minutes of Exercise per Session: Not on file  Stress:   . Feeling of Stress : Not on file  Social Connections:   . Frequency of Communication with Friends and Family: Not  on file  . Frequency of Social Gatherings with Friends and Family: Not on file  . Attends Religious Services: Not on file  . Active Member of Clubs or Organizations: Not on file  . Attends Archivist Meetings: Not on file  . Marital Status: Not on file  Intimate Partner Violence:   . Fear of Current or Ex-Partner: Not on file  . Emotionally Abused: Not on file  . Physically Abused: Not on file  . Sexually Abused: Not on file     ROS:  General: Negative for anorexia, weight loss, fever, chills, fatigue, weakness. Eyes: Negative for vision changes.  ENT: Negative for hoarseness, nasal congestion.see hpi CV: Negative for chest pain, angina, palpitations, dyspnea on exertion, peripheral edema.  Respiratory: Negative for dyspnea at rest, dyspnea on exertion, cough, sputum, wheezing.  GI: See history of present illness. GU:  Negative for dysuria, hematuria, urinary incontinence, urinary frequency, nocturnal urination.  MS: Negative for joint pain, low back pain.  Derm: Negative for rash or itching.  Neuro: Negative for weakness, abnormal sensation, seizure, frequent headaches, memory loss, +confusion. Baseline dementia. Lives at home with son. Has some anxiety "related to her dementia".  Psych: Negative for anxiety, depression, suicidal ideation, hallucinations.  Endo: Negative for unusual weight change.  Heme: Negative for bruising or bleeding. Allergy: Negative for rash or hives.        Physical Examination: Vital signs in last 24 hours: Temp:  [98.5 F (36.9 C)] 98.5 F (36.9 C) (02/04 0516) Pulse Rate:  [88-98] 97 (02/04 0730) Resp:  [16-20] 20 (02/04 0730) BP: (122-152)/(82-91) 122/91 (02/04 0730) SpO2:  [98 %-100 %] 100 % (02/04 0730) Weight:  [62.6 kg] 62.6 kg (02/04 0517)    General: elderly female, appears uncomfortable. Well nourished.  no acute distress.  Head: Normocephalic, atraumatic.   Eyes: Conjunctiva pink, no icterus. Mouth: Oropharyngeal mucosa moist and pink , no lesions erythema or exudate. Neck: Supple without thyromegaly, masses, or lymphadenopathy.  Lungs: Clear to auscultation bilaterally.  Heart: irregular rhythm, no murmurs rubs or gallops.  Abdomen: Bowel sounds are normal, nondistended, no hepatosplenomegaly or masses, no abdominal bruits or    hernia , no rebound or guarding.  Very mild lower abdominal discomfort. Rectal: not performed Extremities: No lower extremity edema, clubbing, deformity.  Neuro: Alert and oriented x 4 , grossly normal neurologically.  Skin: Warm and dry, no rash or jaundice.   Psych: Alert and cooperative, normal mood and affect.        Intake/Output from previous day: No intake/output data recorded. Intake/Output this shift: No intake/output data recorded.  Lab Results: CBC Recent Labs    12/21/19 0609  WBC 5.0  HGB 11.2*  HCT 31.1*  MCV 92.6  PLT 215   BMET Recent Labs    12/21/19 0609  NA 111*  K 2.3*  CL 74*  CO2 20*  GLUCOSE 245*  BUN 7*  CREATININE 0.53  CALCIUM 8.1*   LFT No results for input(s): BILITOT, BILIDIR, IBILI, ALKPHOS, AST, ALT, PROT, ALBUMIN in the last 72 hours.  Lipase No results for input(s): LIPASE in the last 72 hours.  PT/INR No results for input(s): LABPROT, INR in the last 72 hours.   Imaging Studies: No results found.Minnie.Brome week]   Impression: Very pleasant 86 y/o female presenting with esophageal obstruction due to food impaction. She has h/o  esophageal stricture requiring dilation in 2017. She did well until about three months ago and son noted she was having increasing difficulty  with swallowing, sensation of food getting stuck. Having trouble with swallowing oblong pills as well. Yesterday at 3pm, felt like pickle became lodged. Has been unable to eat/drink since. Patient appears uncomfortable. She complains of discomfort in the throat region. Unable to swallow secretions. Had episode of vomiting in ED. Given one dose of glucagon at 0530.   She has profound hyponatremia, hypokalemia, low magnesium.  Spoke to hospitalist, Dr. Denton Brick. Will take 24 hours to correct sodium and potassium. Spoke to Dr. Gala Romney, EGD will be done with general anesthesia, anesthesiology requesting sodium of 120 prior to sedation. She also has new onset Afib. EGD likely tomorrow at the earliest.   Plan: 1. Additional dose of glucagon 1mg  IV. Discussed with Dr. Gala Romney. 2. Keep head of bed at 45 degrees due to risk of aspiration.  3. Strictly NPO.  4. Add IV protonix.  5. EGD once electrolyte abnormalities have been adequately addressed. Son and patient aware.  We would like to thank you for the opportunity to participate in the care of [name].  Laureen Ochs. Bernarda Caffey [facility] [phone] 2/4/20219:15 AM     LOS: 0 days    Addendum: Anesthesiology requesting cardiology consult for Afib. I have notified cardiology and Dr. Denton Brick.   Laureen Ochs. Bernarda Caffey [facility] [phone] 2/4/20219:44 AM

## 2019-12-21 NOTE — ED Notes (Signed)
Date and time results received: 12/21/19 0630  Test: sodium-111  postassium 2.3  Name of Provider Notified: dr.pollina  Orders Received? Or Actions Taken?: n/a

## 2019-12-21 NOTE — ED Provider Notes (Addendum)
[facility] EMERGENCY DEPARTMENT Provider Note   CSN: [id number] Arrival date & time: 12/21/19  [id number]     History Chief Complaint  Patient presents with  . Dysphagia    [name] is a 86 y.o. female.  Patient presents to the emergency department for evaluation of difficulty swallowing.  Patient reports that she was eating yesterday and felt like food got stuck in her throat.  Ever since then she has not been able to swallow anything.  She reports that her saliva is coming back up and if she tries to drink it comes right back up as well.  She has had previous esophageal strictures with dilatation.        Past Medical History:  Diagnosis Date  . Anxiety   . Dementia, senile, with, delirium 10/06/2011  . Depression   . Diverticulosis    Colon   . Hepatic hemangioma   . Hx pulmonary embolism   . Hyperglycemia 10/06/2011  . Hyperlipidemia   . Hypertension   . Normocytic anemia   . Osteopenia 10/08/2003   Dx   . Polypharmacy     Patient Active Problem List   Diagnosis Date Noted  . Dementia, senile, with, delirium 10/06/2011  . Anemia 10/06/2011  . Hyperglycemia 10/06/2011  . Gait abnormality 10/05/2011  . Dehydration 10/05/2011  . Hypokalemia 10/05/2011  . Osteoarthritis 10/05/2011  . Dementia ([place]) 10/05/2011  . ARF (acute renal failure) ([place]) 10/05/2011  . Knee pain 09/10/2011  . OA (osteoarthritis) of knee 09/10/2011  . KNEE, ARTHRITIS, DEGEN./OSTEO 09/11/2009  . KNEE PAIN 01/21/2009  . FIBROMYALGIA/FIBROMYOSITIS 12/10/2008  . FRACTURE, RADIUS, DISTAL 12/10/2008  . WRIST PAIN 09/06/2007  . COLLES' FRACTURE, RIGHT 08/24/2007  . HEMANGIOMA, HEPATIC 09/23/2006  . HYPERLIPIDEMIA 09/23/2006  . ANEMIA, NORMOCYTIC 09/23/2006  . DEPRESSION 09/23/2006  . HYPERTENSION 09/23/2006  . DIVERTICULOSIS, COLON 09/23/2006  . OSTEOPENIA 09/23/2006  . PULMONARY EMBOLISM, HX OF 09/23/2006    Past Surgical History:  Procedure Laterality Date  . ABDOMINAL  HYSTERECTOMY    . BALLOON DILATION N/A 04/21/2016   Procedure: BALLOON DILATION;  Surgeon: Wonda Horner, MD;  Location: [facility];  Service: Endoscopy;  Laterality: N/A;  . ESOPHAGOGASTRODUODENOSCOPY (EGD) WITH PROPOFOL N/A 04/21/2016   Procedure: ESOPHAGOGASTRODUODENOSCOPY (EGD) WITH PROPOFOL;  Surgeon: Wonda Horner, MD;  Location: [facility];  Service: Endoscopy;  Laterality: N/A;  . fibroid tumor on shoulder    . Rt arm /shoulder surgery    . Rt Closed Reduction Percutaneous Pins Ext Fixator  08/26/2007   Dr. Aline Brochure  . vaginal  delivery     x10     OB History   No obstetric history on file.     Family History  Problem Relation Age of Onset  . Diabetes Other        family history   . Cancer Other        family history   . Diabetes Father   . Cancer Sister        Breast  . Cancer Brother        Lung    Social History   Tobacco Use  . Smoking status: Never Smoker  . Smokeless tobacco: Never Used  Substance Use Topics  . Alcohol use: Yes    Comment: wine rarely  . Drug use: No    Home Medications Prior to Admission medications   Medication Sig Start Date End Date Taking? Authorizing Provider  ALPRAZolam (XANAX) 0.5 MG tablet TAKE (1) TABLET BY MOUTH AT  BEDTIME. 10/16/19   Carole Civil, MD  hydrochlorothiazide (HYDRODIURIL) 25 MG tablet  02/14/18   [provider]  HYDROcodone-acetaminophen (NORCO/VICODIN) 5-325 MG tablet TAKE 1 TABLET BY MOUTH EVERY 8 HOURS AS NEEDED FOR MODERATE PAIN. 11/22/19   Carole Civil, MD  memantine [facility]) 10 MG tablet  08/18/19   [provider]  memantine (NAMENDA) 5 MG tablet Take 5 mg by mouth 2 (two) times daily.    [provider]  Menthol (ICY HOT) 5 % PTCH Place 1 patch onto the skin every 8 (eight) hours as needed (For pain.).    [provider]  metoprolol succinate (TOPROL-XL) 50 MG 24 hr tablet Take 50 mg by mouth daily. 04/01/16   [provider]  omeprazole (PRILOSEC)  20 MG capsule  01/19/18   [provider]    Allergies    Erythromycin  Review of Systems   Review of Systems  HENT: Positive for trouble swallowing.   All other systems reviewed and are negative.   Physical Exam Updated Vital Signs BP (!) 152/82   Pulse 88   Temp 98.5 F (36.9 C) (Oral)   Resp 16   Ht 5\' 7"  (1.702 m)   Wt 62.6 kg   SpO2 100%   BMI 21.61 kg/m   Physical Exam Vitals and nursing note reviewed.  Constitutional:      General: She is not in acute distress.    Appearance: Normal appearance. She is well-developed.  HENT:     Head: Normocephalic and atraumatic.     Right Ear: Hearing normal.     Left Ear: Hearing normal.     Nose: Nose normal.  Eyes:     Conjunctiva/sclera: Conjunctivae normal.     Pupils: Pupils are equal, round, and reactive to light.  Cardiovascular:     Rate and Rhythm: Regular rhythm.     Heart sounds: S1 normal and S2 normal. No murmur. No friction rub. No gallop.   Pulmonary:     Effort: Pulmonary effort is normal. No respiratory distress.     Breath sounds: Normal breath sounds.  Chest:     Chest wall: No tenderness.  Abdominal:     General: Bowel sounds are normal.     Palpations: Abdomen is soft.     Tenderness: There is no abdominal tenderness. There is no guarding or rebound. Negative signs include Murphy's sign and McBurney's sign.     Hernia: No hernia is present.  Musculoskeletal:        General: Normal range of motion.     Cervical back: Normal range of motion and neck supple.  Skin:    General: Skin is warm and dry.     Findings: No rash.  Neurological:     Mental Status: She is alert and oriented to person, place, and time.     GCS: GCS eye subscore is 4. GCS verbal subscore is 5. GCS motor subscore is 6.     Cranial Nerves: No cranial nerve deficit.     Sensory: No sensory deficit.     Coordination: Coordination normal.  Psychiatric:        Speech: Speech normal.        Behavior: Behavior normal.          Thought Content: Thought content normal.     ED Results / Procedures / Treatments   Labs (all labs ordered are listed, but only abnormal results are displayed) Labs Reviewed  CBC - Abnormal; Notable for  the following components:      Result Value   RBC 3.36 (*)    Hemoglobin 11.2 (*)    HCT 31.1 (*)    All other components within normal limits  BASIC METABOLIC PANEL - Abnormal; Notable for the following components:   Sodium 111 (*)    Potassium 2.3 (*)    Chloride 74 (*)    CO2 20 (*)    Glucose, Bld 245 (*)    BUN 7 (*)    Calcium 8.1 (*)    Anion gap 17 (*)    All other components within normal limits  RESPIRATORY PANEL BY RT PCR (FLU A&B, COVID)  MAGNESIUM    EKG EKG Interpretation  Date/Time:  Thursday December 21 2019 07:38:23 EST Ventricular Rate:  109 PR Interval:    QRS Duration: 88 QT Interval:  351 QTC Calculation: 473 R Axis:   -31 Text Interpretation: Atrial fibrillation Inferior infarct, old Anterior infarct, old Artifact in lead(s) I II III aVR aVL aVF V2 Confirmed by Orpah Greek [phone]) on 12/21/2019 7:42:04 AM   Radiology No results found.  Procedures Procedures (including critical care time)  Medications Ordered in ED Medications  potassium chloride 10 mEq in 100 mL IVPB (10 mEq Intravenous New Bag/Given 12/21/19 0736)  glucagon (human recombinant) (GLUCAGEN) injection 1 mg (1 mg Intravenous Given 12/21/19 0532)    ED Course  I have reviewed the triage vital signs and the nursing notes.  Pertinent labs & imaging results that were available during my care of the patient were reviewed by me and considered in my medical decision making (see chart for details).    MDM Rules/Calculators/A&P                      Patient with history of esophageal stricture requiring dilatation presents to the ER with sensation of food being stuck in her esophagus after eating last night.  Patient has had persistent sensation of impaction and is unable  to drink any liquids or swallow her own secretions without them coming back up.  She has not had any response to glucagon.  Will consult GI for intervention.  Patient had baseline labs drawn here and she is found to have profound hyponatremia as well as hypokalemia.  This will warrant admission.  She was also noted to have some episodes of tachycardia and is found to be in atrial fibrillation with a rapid ventricular response.  No anticoagulation because of pending endoscopy.  CHA2DS2-VASc Score = 3 The patient's score is based upon: CHF History: No HTN History: No Age : 83 + Diabetes History: No Stroke History: No Vascular Disease History: No Gender: Female      ASSESSMENT AND PLAN: Paroxysmal Atrial Fibrillation (ICD10:  I48.0) The patient's CHA2DS2-VASc score is 3, indicating a 3.2% annual risk of stroke.    Secondary Hypercoagulable State (ICD10:  D68.69) The patient is at significant risk for stroke/thromboembolism based upon her CHA2DS2-VASc Score of 3.  However, the patient is not on anticoagulation due to her high bleeding risk.      Signed,  Orpah Greek, MD    12/21/2019 7:44 AM     Final Clinical Impression(s) / ED Diagnoses Final diagnoses:  Esophageal obstruction due to food impaction    Rx / DC Orders ED Discharge Orders    None       Orpah Greek, MD 12/21/19 [id number]    Orpah Greek, MD 12/21/19 [phone]

## 2019-12-21 NOTE — ED Triage Notes (Signed)
Pt EMS, pt brought in for difficulty swallowing after she ate a pickle yesterday. Pt's son states pt can drink but spits it all back out.

## 2019-12-22 ENCOUNTER — Encounter (HOSPITAL_COMMUNITY): Payer: Self-pay | Admitting: Family Medicine

## 2019-12-22 ENCOUNTER — Telehealth: Payer: Self-pay | Admitting: Nurse Practitioner

## 2019-12-22 ENCOUNTER — Encounter (HOSPITAL_COMMUNITY): Payer: Self-pay | Admitting: Anesthesiology

## 2019-12-22 ENCOUNTER — Encounter (HOSPITAL_COMMUNITY)
Admission: EM | Disposition: A | Discharge status: Home Health | Payer: Self-pay | Source: Home / Self Care | Attending: Family Medicine

## 2019-12-22 DIAGNOSIS — E876 Hypokalemia: Secondary | ICD-10-CM | POA: Diagnosis present

## 2019-12-22 DIAGNOSIS — T18128D Food in esophagus causing other injury, subsequent encounter: Secondary | ICD-10-CM | POA: Diagnosis not present

## 2019-12-22 DIAGNOSIS — Y92009 Unspecified place in unspecified non-institutional (private) residence as the place of occurrence of the external cause: Secondary | ICD-10-CM | POA: Diagnosis not present

## 2019-12-22 DIAGNOSIS — T18128A Food in esophagus causing other injury, initial encounter: Secondary | ICD-10-CM | POA: Diagnosis present

## 2019-12-22 DIAGNOSIS — R739 Hyperglycemia, unspecified: Secondary | ICD-10-CM | POA: Diagnosis present

## 2019-12-22 DIAGNOSIS — R7303 Prediabetes: Secondary | ICD-10-CM | POA: Diagnosis present

## 2019-12-22 DIAGNOSIS — Z79891 Long term (current) use of opiate analgesic: Secondary | ICD-10-CM | POA: Diagnosis not present

## 2019-12-22 DIAGNOSIS — I4891 Unspecified atrial fibrillation: Secondary | ICD-10-CM | POA: Diagnosis not present

## 2019-12-22 DIAGNOSIS — Z803 Family history of malignant neoplasm of breast: Secondary | ICD-10-CM | POA: Diagnosis not present

## 2019-12-22 DIAGNOSIS — Z90710 Acquired absence of both cervix and uterus: Secondary | ICD-10-CM | POA: Diagnosis not present

## 2019-12-22 DIAGNOSIS — Z801 Family history of malignant neoplasm of trachea, bronchus and lung: Secondary | ICD-10-CM | POA: Diagnosis not present

## 2019-12-22 DIAGNOSIS — I4819 Other persistent atrial fibrillation: Secondary | ICD-10-CM | POA: Diagnosis present

## 2019-12-22 DIAGNOSIS — R1314 Dysphagia, pharyngoesophageal phase: Secondary | ICD-10-CM | POA: Diagnosis not present

## 2019-12-22 DIAGNOSIS — K222 Esophageal obstruction: Secondary | ICD-10-CM | POA: Diagnosis not present

## 2019-12-22 DIAGNOSIS — Z833 Family history of diabetes mellitus: Secondary | ICD-10-CM | POA: Diagnosis not present

## 2019-12-22 DIAGNOSIS — M8580 Other specified disorders of bone density and structure, unspecified site: Secondary | ICD-10-CM | POA: Diagnosis present

## 2019-12-22 DIAGNOSIS — Z7189 Other specified counseling: Secondary | ICD-10-CM | POA: Diagnosis not present

## 2019-12-22 DIAGNOSIS — F329 Major depressive disorder, single episode, unspecified: Secondary | ICD-10-CM | POA: Diagnosis present

## 2019-12-22 DIAGNOSIS — E871 Hypo-osmolality and hyponatremia: Secondary | ICD-10-CM | POA: Diagnosis present

## 2019-12-22 DIAGNOSIS — R339 Retention of urine, unspecified: Secondary | ICD-10-CM | POA: Diagnosis present

## 2019-12-22 DIAGNOSIS — F0390 Unspecified dementia without behavioral disturbance: Secondary | ICD-10-CM | POA: Diagnosis present

## 2019-12-22 DIAGNOSIS — E785 Hyperlipidemia, unspecified: Secondary | ICD-10-CM | POA: Diagnosis present

## 2019-12-22 DIAGNOSIS — Z79899 Other long term (current) drug therapy: Secondary | ICD-10-CM | POA: Diagnosis not present

## 2019-12-22 DIAGNOSIS — T502X5A Adverse effect of carbonic-anhydrase inhibitors, benzothiadiazides and other diuretics, initial encounter: Secondary | ICD-10-CM | POA: Diagnosis present

## 2019-12-22 DIAGNOSIS — Z881 Allergy status to other antibiotic agents status: Secondary | ICD-10-CM | POA: Diagnosis not present

## 2019-12-22 DIAGNOSIS — I10 Essential (primary) hypertension: Secondary | ICD-10-CM | POA: Diagnosis present

## 2019-12-22 DIAGNOSIS — F419 Anxiety disorder, unspecified: Secondary | ICD-10-CM | POA: Diagnosis present

## 2019-12-22 DIAGNOSIS — Z20822 Contact with and (suspected) exposure to covid-19: Secondary | ICD-10-CM | POA: Diagnosis present

## 2019-12-22 DIAGNOSIS — Z86711 Personal history of pulmonary embolism: Secondary | ICD-10-CM | POA: Diagnosis not present

## 2019-12-22 LAB — GLUCOSE, CAPILLARY
Glucose-Capillary: 115 mg/dL — ABNORMAL HIGH (ref 70–99)
Glucose-Capillary: 118 mg/dL — ABNORMAL HIGH (ref 70–99)
Glucose-Capillary: 123 mg/dL — ABNORMAL HIGH (ref 70–99)
Glucose-Capillary: 123 mg/dL — ABNORMAL HIGH (ref 70–99)
Glucose-Capillary: 92 mg/dL (ref 70–99)
Glucose-Capillary: 96 mg/dL (ref 70–99)

## 2019-12-22 LAB — MAGNESIUM: Magnesium: 2 mg/dL (ref 1.7–2.4)

## 2019-12-22 LAB — CBC
HCT: 33.1 % — ABNORMAL LOW (ref 36.0–46.0)
Hemoglobin: 11.5 g/dL — ABNORMAL LOW (ref 12.0–15.0)
MCH: 31.9 pg (ref 26.0–34.0)
MCHC: 34.7 g/dL (ref 30.0–36.0)
MCV: 91.7 fL (ref 80.0–100.0)
Platelets: 229 10*3/uL (ref 150–400)
RBC: 3.61 MIL/uL — ABNORMAL LOW (ref 3.87–5.11)
RDW: 12 % (ref 11.5–15.5)
WBC: 15 10*3/uL — ABNORMAL HIGH (ref 4.0–10.5)
nRBC: 0 % (ref 0.0–0.2)

## 2019-12-22 LAB — RENAL FUNCTION PANEL
Albumin: 3.1 g/dL — ABNORMAL LOW (ref 3.5–5.0)
Anion gap: 10 (ref 5–15)
BUN: 8 mg/dL (ref 8–23)
CO2: 18 mmol/L — ABNORMAL LOW (ref 22–32)
Calcium: 7.4 mg/dL — ABNORMAL LOW (ref 8.9–10.3)
Chloride: 89 mmol/L — ABNORMAL LOW (ref 98–111)
Creatinine, Ser: 0.53 mg/dL (ref 0.44–1.00)
GFR calc Af Amer: >60 mL/min (ref >60)
GFR calc non Af Amer: >60 mL/min (ref >60)
Glucose, Bld: 117 mg/dL — ABNORMAL HIGH (ref 70–99)
Phosphorus: 1.6 mg/dL — ABNORMAL LOW (ref 2.5–4.6)
Potassium: 4.2 mmol/L (ref 3.5–5.1)
Sodium: 117 mmol/L — CL (ref 135–145)

## 2019-12-22 LAB — BASIC METABOLIC PANEL
Anion gap: 10 (ref 5–15)
BUN: 8 mg/dL (ref 8–23)
CO2: 18 mmol/L — ABNORMAL LOW (ref 22–32)
Calcium: 7.3 mg/dL — ABNORMAL LOW (ref 8.9–10.3)
Chloride: 84 mmol/L — ABNORMAL LOW (ref 98–111)
Creatinine, Ser: 0.5 mg/dL (ref 0.44–1.00)
GFR calc Af Amer: >60 mL/min (ref >60)
GFR calc non Af Amer: >60 mL/min (ref >60)
Glucose, Bld: 127 mg/dL — ABNORMAL HIGH (ref 70–99)
Potassium: 4.2 mmol/L (ref 3.5–5.1)
Sodium: 112 mmol/L — CL (ref 135–145)

## 2019-12-22 LAB — HEMOGLOBIN A1C
Hgb A1c MFr Bld: 5.7 % — ABNORMAL HIGH (ref 4.8–5.6)
Mean Plasma Glucose: 116.89 mg/dL

## 2019-12-22 LAB — TSH: TSH: 1.167 u[IU]/mL (ref 0.350–4.500)

## 2019-12-22 SURGERY — ESOPHAGOGASTRODUODENOSCOPY (EGD) WITH PROPOFOL
Anesthesia: Choice

## 2019-12-22 MED ORDER — ALPRAZOLAM 0.25 MG PO TABS
0.2500 mg | ORAL_TABLET | Freq: Three times a day (TID) | ORAL | Status: DC | PRN
  Administered 2019-12-23: 0.25 mg via ORAL
  Filled 2019-12-22: qty 1

## 2019-12-22 MED ORDER — MEMANTINE HCL 10 MG PO TABS
10.0000 mg | ORAL_TABLET | Freq: Every day | ORAL | Status: DC
  Administered 2019-12-22 – 2019-12-24 (×3): 10 mg via ORAL
  Filled 2019-12-22 (×3): qty 1

## 2019-12-22 MED ORDER — METOPROLOL SUCCINATE ER 50 MG PO TB24
50.0000 mg | ORAL_TABLET | Freq: Every day | ORAL | Status: DC
  Administered 2019-12-22 – 2019-12-24 (×3): 50 mg via ORAL
  Filled 2019-12-22 (×3): qty 1

## 2019-12-22 MED ORDER — MORPHINE SULFATE (PF) 2 MG/ML IV SOLN
1.0000 mg | INTRAVENOUS | Status: DC | PRN
  Administered 2019-12-23: 1 mg via INTRAVENOUS
  Filled 2019-12-22: qty 1

## 2019-12-22 MED ORDER — SODIUM CHLORIDE 0.9 % IV SOLN: INTRAVENOUS | Status: DC

## 2019-12-22 MED ORDER — METOPROLOL TARTRATE 5 MG/5ML IV SOLN: 2.5000 mg | Freq: Three times a day (TID) | INTRAVENOUS | Status: DC | PRN

## 2019-12-22 MED ORDER — LACTATED RINGERS IV SOLN
INTRAVENOUS | Status: DC
  Administered 2019-12-22: 1000 mL via INTRAVENOUS

## 2019-12-22 NOTE — Progress Notes (Signed)
[facility] will sign off.   Medication Recommendations: See consult note on 12/21/2019 Other recommendations (labs, testing, etc): None Follow up as an outpatient: We will arrange

## 2019-12-22 NOTE — Progress Notes (Signed)
CRITICAL VALUE ALERT  Critical Value:sodium 112  Date & Time Notied: 12-21-2019 2345  Provider Notified: eubanks, np  Orders Received/Actions taken: received orders to increase sodium iv fluids

## 2019-12-22 NOTE — Progress Notes (Signed)
Pt's son signed consent for EGD. Procedure staff here with stretcher to escort pt. No voiced c/o from pt.

## 2019-12-22 NOTE — Progress Notes (Signed)
After son spoke with Dr. Hilaria Ota & Dr. Gala Romney both were in agreement that procedure was not needed. Pt. given cup of ice water to drink per Dr. Roseanne Kaufman order & pt. Was monitored.  Water went down with no difficulty, pt. denied any pain or discomfort & stated it felt like everything was going down.  Son at bedisde & in agreement.  Report given to floor RN & pt. taken back to room.

## 2019-12-22 NOTE — Progress Notes (Signed)
Patient sodium is 117, is improving.  Will continue to monitor patient.

## 2019-12-22 NOTE — Anesthesia Preprocedure Evaluation (Deleted)
Anesthesia Evaluation    Airway        Dental   Pulmonary           Cardiovascular hypertension,      Neuro/Psych    GI/Hepatic   Endo/Other    Renal/GU      Musculoskeletal   Abdominal   Peds  Hematology   Anesthesia Other Findings   Reproductive/Obstetrics                             Anesthesia Physical Anesthesia Plan  ASA:   Anesthesia Plan:    Post-op Pain Management:    Induction:   PONV Risk Score and Plan:   Airway Management Planned:   Additional Equipment:   Intra-op Plan:   Post-operative Plan:   Informed Consent:   Plan Discussed with:   Anesthesia Plan Comments: (Pt with low Na, appears other electrolyte abnormalities corrected,  Son had many concerns about general in a demented 86 year old with Na less than 120. Pt appears without symptoms, has no issues with secretions   After d/w Dr. Burnell Blanks - pt was given a trial of h2o and did beautifully  Dr. Gala Romney decided to delay and possibly strech as OP once Na improved  Son and Pt were very pleased )        Anesthesia Quick Evaluation

## 2019-12-22 NOTE — Progress Notes (Signed)
Subjective: Patient resting comfortably with her son at the bedside. Feeling better today, no more throat pain, handling secretions well. Mild generalized abdominal pain. No N/V. Denies chest pain, palpitations. No other GI complaints.  Objective: Vital signs in last 24 hours: Temp:  [97.6 F (36.4 C)-98.6 F (37 C)] 98.5 F (36.9 C) (02/05 0402) Pulse Rate:  [85-108] 95 (02/05 0402) Resp:  [16-20] 16 (02/05 0402) BP: (110-151)/(62-98) 142/68 (02/05 0402) SpO2:  [98 %-100 %] 100 % (02/05 0402) Weight:  [59.5 kg] 59.5 kg (02/04 1029) Last BM Date: 12/20/19 General:   Alert and oriented, pleasant Eyes:  No icterus, sclera clear. Conjuctiva pink.  Heart:  S1, S2 present, no murmurs noted.  Lungs: Clear to auscultation bilaterally, without wheezing, rales, or rhonchi.  Abdomen:  Bowel sounds present, soft, non-tender, non-distended. No HSM or hernias noted. No rebound or guarding. No masses appreciated  Msk:  Symmetrical without gross deformities. Normal posture. Pulses:  Normal bilateral DP pulses noted. Extremities:  Without clubbing or edema. Neurologic:  Alert and  oriented x4 Skin:  Warm and dry, intact without significant lesions.  Psych:  Alert and cooperative. Normal mood and affect.  Intake/Output from previous day: 02/04 0701 - 02/05 0700 In: 1158.3 [I.V.:391.6; IV Piggyback:766.8] Out: 1050 [Urine:1050] Intake/Output this shift: No intake/output data recorded.  Lab Results: Recent Labs    12/21/19 0609 12/22/19 0608  WBC 5.0 15.0*  HGB 11.2* 11.5*  HCT 31.1* 33.1*  PLT 215 229   BMET Recent Labs    12/21/19 1740 12/21/19 2325 12/22/19 0608  NA 112* 112* 117*  K 3.5 4.2 4.2  CL 81* 84* 89*  CO2 21* 18* 18*  GLUCOSE 139* 127* 117*  BUN _0 CREATININE 0.46 0.50 0.53  CALCIUM 7.4* 7.3* 7.4*   LFT Recent Labs    12/22/19 0608  ALBUMIN 3.1*   PT/INR No results for input(s): LABPROT, INR in the last 72 hours. Hepatitis Panel No results for  input(s): HEPBSAG, HCVAB, HEPAIGM, HEPBIGM in the last 72 hours.   Studies/Results: ECHOCARDIOGRAM COMPLETE  Result Date: 12/21/2019   ECHOCARDIOGRAM REPORT   Patient Name:   [name] Date of Exam: 12/21/2019 Medical Rec #:  [id number]      Height:       67.0 in Accession #:    [id number]     Weight:       131.2 lb Date of Birth:  September 28, 1926      BSA:          1.69 m Patient Age:    86 years       BP:           128/98 mmHg Patient Gender: F              HR:           108 bpm. Exam Location:  [facility] Procedure: 2D Echo Indications:    Atrial Fibrillation 427.31 / I48.91  History:        Patient has no prior history of Echocardiogram examinations.                 Arrythmias:Atrial Fibrillation; Risk Factors:Dyslipidemia,                 Hypertension and Non-Smoker. Food impaction of esophagus,                 PULMONARY EMBOLISM, HX OF, ARF (acute renal failure.  Sonographer:    Leavy Cella  RDCS (AE) Referring Phys: [id number] [place]  1. Left ventricular ejection fraction, by visual estimation, is 65 to 70%. The left ventricle has hyperdynamic function. There is mildly increased left ventricular hypertrophy.  2. Left ventricular diastolic function could not be evaluated.  3. The left ventricle has no regional wall motion abnormalities.  4. Global right ventricle has normal systolic function.The right ventricular size is normal. No increase in right ventricular wall thickness.  5. Left atrial size was normal.  6. Right atrial size was normal.  7. The mitral valve is grossly normal. No evidence of mitral valve regurgitation.  8. The tricuspid valve is grossly normal.  9. The tricuspid valve is grossly normal. Tricuspid valve regurgitation is trivial. 10. The aortic valve is tricuspid. Aortic valve regurgitation is not visualized. 11. The pulmonic valve was grossly normal. Pulmonic valve regurgitation is not visualized. 12. The inferior vena cava is normal in size with greater than 50%  respiratory variability, suggesting right atrial pressure of 3 mmHg. FINDINGS  Left Ventricle: Left ventricular ejection fraction, by visual estimation, is 65 to 70%. The left ventricle has hyperdynamic function. The left ventricle has no regional wall motion abnormalities. The left ventricular internal cavity size was the left ventricle is normal in size. There is mildly increased left ventricular hypertrophy. Concentric left ventricular hypertrophy. The left ventricular diastology could not be evaluated due to atrial fibrillation. Left ventricular diastolic function could not  be evaluated. Right Ventricle: The right ventricular size is normal. No increase in right ventricular wall thickness. Global RV systolic function is has normal systolic function. Left Atrium: Left atrial size was normal in size. Right Atrium: Right atrial size was normal in size Pericardium: There is no evidence of pericardial effusion. Mitral Valve: The mitral valve is grossly normal. No evidence of mitral valve regurgitation. Tricuspid Valve: The tricuspid valve is grossly normal. Tricuspid valve regurgitation is trivial. Aortic Valve: The aortic valve is tricuspid. Aortic valve regurgitation is not visualized. Pulmonic Valve: The pulmonic valve was grossly normal. Pulmonic valve regurgitation is not visualized. Pulmonic regurgitation is not visualized. Aorta: The aortic root is normal in size and structure. Venous: The inferior vena cava is normal in size with greater than 50% respiratory variability, suggesting right atrial pressure of 3 mmHg. IAS/Shunts: No atrial level shunt detected by color flow Doppler.  LEFT VENTRICLE PLAX 2D LVIDd:         3.58 cm  Diastology LVIDs:         1.66 cm  LV e' lateral:   7.30 cm/s LV PW:         1.04 cm  LV E/e' lateral: 9.8 LV IVS:        1.18 cm  LV e' medial:    9.09 cm/s LVOT diam:     2.00 cm  LV E/e' medial:  7.9 LV SV:         46 ml LV SV Index:   27.38 LVOT Area:     3.14 cm  RIGHT VENTRICLE  RV S prime:     17.90 cm/s TAPSE (M-mode): 2.3 cm LEFT ATRIUM             Index       RIGHT ATRIUM           Index LA diam:        2.90 cm 1.72 cm/m  RA Area:     13.00 cm LA Vol (A2C):   22.2 ml 13.13 ml/m RA Volume:  27.00 ml  15.97 ml/m LA Vol (A4C):   32.9 ml 19.46 ml/m LA Biplane Vol: 29.6 ml 17.51 ml/m   AORTA Ao Root diam: 2.80 cm MITRAL VALVE MV Area (PHT): 2.48 cm              SHUNTS MV PHT:        88.74 msec            Systemic Diam: 2.00 cm MV Decel Time: 306 msec MV E velocity: 71.60 cm/s  103 cm/s MV A velocity: 103.00 cm/s 70.3 cm/s MV E/A ratio:  0.70        1.5  Kate Sable MD Electronically signed by Kate Sable MD Signature Date/Time: 12/21/2019/11:35:10 AM    Final     Assessment: Very pleasant 86 y/o female presenting with esophageal obstruction due to food impaction. She has h/o esophageal stricture requiring dilation in 2017. She did well until about three months ago and son noted she was having increasing difficulty with swallowing, sensation of food getting stuck. Having trouble with swallowing oblong pills as well. Two days ago at 3pm, felt like pickle became lodged. Has been unable to eat/drink since. Patient appeared uncomfortable on initial presentation and complaints of discomfort in the throat region. Unable to swallow secretions upon admission. Had episode of vomiting in ED. Given one dose of glucagon at 0530 and another dose yesterday.   EGD not feasible initially due to profound hyponatremia, hypokalemia, low magnesium.  Due to NPO hospitalist initiating electrolyte repletion via IVF (NS) and IV electrolytes. Previous recommendation for EGD to be done with general anesthesia, anesthesiology requesting sodium of 120 prior to sedation. Developed new onset Afib yesterday and was seen by cardiology.  Today she appears much more comfortable, no throat discomfort and no chest pain. Patient appeared to convert to NSR last night. Today, I directly viewed her EKG  strip (live) and was in NSR/ST 90-101. Discussed the EGD possible for today with the patient and her son, answered questions, and they are agreeable to proceed.  Spoke with Anesthesiologist about patient details and tentatively ok if cardiac status remains stable.  Proceed with EGD +/- dilation on propofol/MAC with Dr. Gala Romney in near future: the risks, benefits, and alternatives have been discussed with the patient in detail. The patient states understanding and desires to proceed.   Plan: 1. Continue HOB at 45 degrees 2. Continued NPO 3. Continue electrolyte replacement (with normal saline) and monitoring 4. Monitor cardiac status/rhythm for any changes 5. Continued PPI 6. Plan for EGD today 7. Supportive measures   Thank you for allowing [place] to participate in the care of [name]  Walden Field, DNP, AGNP-C Adult & Gerontological Nurse Practitioner [facility]     LOS: 0 days    12/22/2019, 8:42 AM

## 2019-12-22 NOTE — Plan of Care (Signed)
  Problem: Acute Rehab PT Goals(only PT should resolve) Goal: Patient Will Transfer Sit To/From Stand Outcome: Progressing Flowsheets (Taken 12/22/2019 1547) Patient will transfer sit to/from stand: with minimal assist Goal: Pt Will Transfer Bed To Chair/Chair To Bed Outcome: Progressing Flowsheets (Taken 12/22/2019 1547) Pt will Transfer Bed to Chair/Chair to Bed: with min assist Goal: Pt Will Ambulate Outcome: Progressing Flowsheets (Taken 12/22/2019 1547) Pt will Ambulate:  25 feet  with minimal assist Goal: Pt/caregiver will Perform Home Exercise Program Outcome: Progressing Flowsheets (Taken 12/22/2019 1547) Pt/caregiver will Perform Home Exercise Program:  For increased strengthening  For improved balance  With Supervision, verbal cues required/provided   3:49 PM, 12/22/19 Mearl Latin PT, DPT Physical Therapist at [facility]

## 2019-12-22 NOTE — Progress Notes (Signed)
Patient seen in short stay by me and anesthesia in anticipation of an EGD with removal of food impaction. Patient has had a good day today.  Electrolytes are being repleted.  Normal sinus rhythm on monitor this morning.  Mild sinus tachycardia at this time. Patient  no longer having difficulties with her oral secretions.  No GI distress whatsoever.  Son in attendance.  He is concerned that food impaction may have passed.  With patient's baseline dementia is difficult to tell. He is hopeful that it has passed and an emergency EGD would not be needed at this time. After some discussion we gave the patient a trial of clear liquids (H2O).  We watched her consume 8 ounces of water with a couple of small belches.  No regurgitation symptoms or vomiting. Given clinical improvement, we will hold up on emergency EGD at this time.  I explained to son in some detail that she has had crescendo dysphagia symptoms, history of esophageal stricture, and would likely benefit from an EGD with dilation at some point.   He desires for her to get over her acute illness and have her electrolytes totally repleted then reassess as an outpatient.  This is certainly not unreasonable.  Patient and son admonished she will need to stay on a strict full liquid diet and avoid things like meat bread and raw vegetables prior to getting her esophagus dilated.  We will arrange short interval follow-up with Dr. Oneida Alar.  She should go home on a PPI.  I discussed at length with Dr. Irwin Brakeman and updated him.

## 2019-12-22 NOTE — Evaluation (Signed)
Physical Therapy Evaluation Patient Details Name: [name] MRN: [id number] DOB: [birth year] Today's Date: 12/22/2019   History of Present Illness  [name]  is a 86 y.o. female with PMHx of  HTN, HLD, prior espophageal stricture previously requiring EGD with dilatation,, dementia and remote history of PE (occurring in 2005) who presents with intractable emesis and inability to swallow since 12/20/2019    Clinical Impression  Patient limited for functional mobility as stated below secondary to BLE weakness, fatigue and poor standing balance.  Patient requires min assist to transition to seated EOB and mod assist to transition to standing. Patient able to stand with RW and min assist secondary to LE weakness and impaired activity tolerance. She is able to take a few small steps at bedside but is limited due to incontinence. She shows good sitting tolerance seated EOB. Patient's IV is pulled from L hand at end of session - RN notified and in room at [place]. Patient's son states he is able to continue to provide care to his mother as he has been for the last 52 years.  Patient will benefit from continued physical therapy in hospital and recommended venue below to increase strength, balance, endurance for safe ADLs and gait.     Follow Up Recommendations Home health PT    Equipment Recommendations  None recommended by PT    Recommendations for Other Services       Precautions / Restrictions Precautions Precautions: Fall Restrictions Weight Bearing Restrictions: No      Mobility  Bed Mobility Overal bed mobility: Needs Assistance Bed Mobility: Supine to Sit;Sit to Supine     Supine to sit: Min assist Sit to supine: Min assist   General bed mobility comments: to transition to seated EOB  Transfers Overall transfer level: Needs assistance Equipment used: Rolling walker (2 wheeled) Transfers: Sit to/from Stand Sit to Stand: Mod assist         General transfer comment:  requires RW and mod assist to tranfer to standing x3  Ambulation/Gait Ambulation/Gait assistance: Min assist Gait Distance (Feet): 2 Feet Assistive device: Rolling walker (2 wheeled) Gait Pattern/deviations: Decreased step length - right;Decreased step length - left;Decreased stride length Gait velocity: decreased   General Gait Details: limited to few small labored steps at bedside due to incontinence  Stairs            Wheelchair Mobility    Modified Rankin (Stroke Patients Only)       Balance Overall balance assessment: Needs assistance Sitting-balance support: Feet supported Sitting balance-Leahy Scale: Good Sitting balance - Comments: seated EOB   Standing balance support: Bilateral upper extremity supported;During functional activity Standing balance-Leahy Scale: Poor Standing balance comment: using RW                             Pertinent Vitals/Pain Pain Assessment: No/denies pain    Home Living Family/patient expects to be discharged to:: Private residence Living Arrangements: Children Available Help at Discharge: Family;Available 24 hours/day Type of Home: House Home Access: Stairs to enter Entrance Stairs-Rails: Can reach both Entrance Stairs-Number of Steps: 2 Home Layout: Two level;Able to live on main level with bedroom/bathroom Home Equipment: Gilford Rile - 2 wheels;Cane - single point;Bedside commode;Grab bars - tub/shower;Shower seat Additional Comments: has lift chair    Prior Function Level of Independence: Needs assistance   Gait / Transfers Assistance Needed: houshold ambulator with RW  ADL's / Homemaking Assistance Needed: son assists  Hand Dominance        Extremity/Trunk Assessment   Upper Extremity Assessment Upper Extremity Assessment: Generalized weakness    Lower Extremity Assessment Lower Extremity Assessment: Generalized weakness       Communication   Communication: No difficulties  Cognition  Arousal/Alertness: Awake/alert Behavior During Therapy: WFL for tasks assessed/performed Overall Cognitive Status: Within Functional Limits for tasks assessed                                        General Comments      Exercises     Assessment/Plan    PT Assessment Patient needs continued PT services  PT Problem List Decreased strength;Decreased mobility;Decreased activity tolerance;Decreased balance       PT Treatment Interventions DME instruction;Therapeutic exercise;Gait training;Balance training;Neuromuscular re-education;Stair training;Functional mobility training;Therapeutic activities;Patient/family education    PT Goals (Current goals can be found in the Care Plan section)  Acute Rehab PT Goals Patient Stated Goal: to return home with son to assist PT Goal Formulation: With patient/family Time For Goal Achievement: 12/29/19 Potential to Achieve Goals: Good    Frequency Min 3X/week   Barriers to discharge        Co-evaluation               AM-PAC PT "6 Clicks" Mobility  Outcome Measure Help needed turning from your back to your side while in a flat bed without using bedrails?: None Help needed moving from lying on your back to sitting on the side of a flat bed without using bedrails?: A Little Help needed moving to and from a bed to a chair (including a wheelchair)?: A Little Help needed standing up from a chair using your arms (e.g., wheelchair or bedside chair)?: A Little Help needed to walk in hospital room?: A Lot Help needed climbing 3-5 steps with a railing? : A Lot 6 Click Score: 17    End of Session Equipment Utilized During Treatment: Gait belt Activity Tolerance: Patient tolerated treatment well Patient left: in bed;with nursing/sitter in room;with call bell/phone within reach;with family/visitor present Nurse Communication: Mobility status PT Visit Diagnosis: Unsteadiness on feet (R26.81);Other abnormalities of gait and  mobility (R26.89);Muscle weakness (generalized) (M62.81)    Time: [id number] PT Time Calculation (min) (ACUTE ONLY): 37 min   Charges:   PT Evaluation $PT Eval Low Complexity: 1 Low PT Treatments $Therapeutic Activity: 23-37 mins        3:44 PM, 12/22/19 Mearl Latin PT, DPT Physical Therapist at [facility]

## 2019-12-22 NOTE — Telephone Encounter (Signed)
Patient historically has seen Dr. Penelope Coop of [facility] (last EGD/dilation in 2017; last OV May 2019). Spoke with Larene Beach, his assistant, regarding patient. Gave summary of food impaction, electrolyte issues, hold on EGD d/t passed impaction. Gave son's name and contact information for follow-up. Recommended 2-week EGD/dilation as schedule allows. Informed of planned strict full liquid diet int he interim. She will work on scheduling outpatient evaluation with PA for anticipated EGD.

## 2019-12-22 NOTE — Progress Notes (Signed)
PROGRESS NOTE [name]   [name]  [id number]  DOB: 07/12/1926  DOA: 12/21/2019 PCP: Stephanie Beard, [name]   Brief Admission Hx: 86 y.o. female with PMHx of  HTN, HLD, prior espophageal stricture previously requiring EGD with dilatation,, dementia and remote history of PE (occurring in 2005) who presents with intractable emesis and inability to swallow since 12/20/2019.  She was noted to have severe hyponatremia and dehydration.  She was in atrial fibrillation with RVR.  She was seen by cardiology.  Plan is for her to have EGD to 12/22/19.   MDM/Assessment & Plan:   1. Esophageal food impaction-patient has been seen by GI and they are planning for EGD later today under anesthesia.  She remains n.p.o. at this time.  She is on IV Protonix.  She has required esophageal dilatation in the past. 2. New onset A. fib with RVR.  The patient has been seen by cardiology and recommendations are noted.  She is on IV metoprolol.  Her rate is controlled now.  Follow-up TSH.  Anticoagulation per cardiology recommendations. 3. Hyponatremia-suspect secondary to HCTZ use and poor oral intake.  The patient has been given IV fluid hydration and her sodium has slowly been improving.  She is feeling better.  Her sodium is 117.  Continue normal saline infusion.  Recheck BMP in a.m.  Continue to hold HCTZ. 4. Hyperglycemia-question if this is stress related, A1c pending. 5. Social: Patient lives with son and has underlying dementia.  PT evaluation requested.  DVT prophylaxis: SCDs Code Status: Full Family Communication: Son at bedside Disposition Plan: Continue inpatient management for subspecialty consultation and procedures noted above, IV fluids   Consultants:  GI  Procedures:  EGD tentatively planned for 12/22/2019  Antimicrobials:     Subjective: Patient reports that she is starting to feel better with IV fluids.  Objective: Vitals:   12/21/19 1942 12/21/19 2143 12/22/19 0402 12/22/19  0900  BP:  (!) 149/72 (!) 142/68 (!) 141/70  Pulse:  87 95 97  Resp:   16 16  Temp:  98.6 F (37 C) 98.5 F (36.9 C) 99.1 F (37.3 C)  TempSrc:  Oral Oral Oral  SpO2: 100% 99% 100%   Weight:      Height:        Intake/Output Summary (Last 24 hours) at 12/22/2019 1206 Last data filed at 12/22/2019 0900 Gross per 24 hour  Intake 1158.32 ml  Output 1050 ml  Net 108.32 ml   Filed Weights   12/21/19 0517 12/21/19 1029  Weight: 62.6 kg 59.5 kg     REVIEW OF SYSTEMS  As per history otherwise all reviewed and reported negative  Exam:  General exam: Elderly female lying in bed she is awake and alert in no distress. Respiratory system: Clear. No increased work of breathing. Cardiovascular system: S1 & S2 heard. No JVD, murmurs, gallops, clicks or pedal edema. Gastrointestinal system: Abdomen is nondistended, soft and nontender. Normal bowel sounds heard. Central nervous system: Alert and oriented to person. No focal neurological deficits. Extremities: no CCE.  Data Reviewed: Basic Metabolic Panel: Recent Labs  Lab 12/21/19 0609 12/21/19 1204 12/21/19 1740 12/21/19 2325 12/22/19 0608  NA 111* 112* 112* 112* 117*  K 2.3* 2.9* 3.5 4.2 4.2  CL 74* 80* 81* 84* 89*  CO2 20* 21* 21* 18* 18*  GLUCOSE 245* 205* 139* 127* 117*  BUN 7* 7* 8 8 8   CREATININE 0.53 0.45 0.46 0.50 0.53  CALCIUM 8.1* 7.7* 7.4* 7.3* 7.4*  MG 1.0*  --  2.4  --  2.0  PHOS  --   --   --   --  1.6*   Liver Function Tests: Recent Labs  Lab 12/22/19 0608  ALBUMIN 3.1*   No results for input(s): LIPASE, AMYLASE in the last 168 hours. No results for input(s): AMMONIA in the last 168 hours. CBC: Recent Labs  Lab 12/21/19 0609 12/22/19 0608  WBC 5.0 15.0*  HGB 11.2* 11.5*  HCT 31.1* 33.1*  MCV 92.6 91.7  PLT 215 229   Cardiac Enzymes: No results for input(s): CKTOTAL, CKMB, CKMBINDEX, TROPONINI in the last 168 hours. CBG (last 3)  Recent Labs    12/22/19 0403 12/22/19 0752 12/22/19 1057    GLUCAP 115* 123* 96   Recent Results (from the past 240 hour(s))  Respiratory Panel by RT PCR (Flu A&B, Covid) - Nasopharyngeal Swab     Status: None   Collection Time: 12/21/19  5:33 AM   Specimen: Nasopharyngeal Swab  Result Value Ref Range Status   SARS Coronavirus 2 by RT PCR NEGATIVE NEGATIVE Final    Comment: (NOTE) SARS-CoV-2 target nucleic acids are NOT DETECTED. The SARS-CoV-2 RNA is generally detectable in upper respiratoy specimens during the acute phase of infection. The lowest concentration of SARS-CoV-2 viral copies this assay can detect is 131 copies/mL. A negative result does not preclude SARS-Cov-2 infection and should not be used as the sole basis for treatment or other patient management decisions. A negative result may occur with  improper specimen collection/handling, submission of specimen other than nasopharyngeal swab, presence of viral mutation(s) within the areas targeted by this assay, and inadequate number of viral copies (<131 copies/mL). A negative result must be combined with clinical observations, patient history, and epidemiological information. The expected result is Negative. Fact Sheet for Patients:  PinkCheek.be Fact Sheet for Healthcare Providers:  GravelBags.it This test is not yet ap proved or cleared by the [place] FDA and  has been authorized for detection and/or diagnosis of SARS-CoV-2 by FDA under an Emergency Use Authorization (EUA). This EUA will remain  in effect (meaning this test can be used) for the duration of the COVID-19 declaration under Section 564(b)(1) of the Act, 21 U.S.C. section 360bbb-3(b)(1), unless the authorization is terminated or revoked sooner.    Influenza A by PCR NEGATIVE NEGATIVE Final   Influenza B by PCR NEGATIVE NEGATIVE Final    Comment: (NOTE) The Xpert Xpress SARS-CoV-2/FLU/RSV assay is intended as an aid in  the diagnosis of influenza  from Nasopharyngeal swab specimens and  should not be used as a sole basis for treatment. Nasal washings and  aspirates are unacceptable for Xpert Xpress SARS-CoV-2/FLU/RSV  testing. Fact Sheet for Patients: PinkCheek.be Fact Sheet for Healthcare Providers: GravelBags.it This test is not yet approved or cleared by the [place] FDA and  has been authorized for detection and/or diagnosis of SARS-CoV-2 by  FDA under an Emergency Use Authorization (EUA). This EUA will remain  in effect (meaning this test can be used) for the duration of the  Covid-19 declaration under Section 564(b)(1) of the Act, 21  U.S.C. section 360bbb-3(b)(1), unless the authorization is  terminated or revoked. Performed at [facility], [address] [place], [place] [address]      Studies: ECHOCARDIOGRAM COMPLETE  Result Date: 12/21/2019   ECHOCARDIOGRAM REPORT   Patient Name:   [name] Date of Exam: 12/21/2019 Medical Rec #:  [id number]      Height:  67.0 in Accession #:    [id number]     Weight:       131.2 lb Date of Birth:  [birth year]      BSA:          1.69 m Patient Age:    53 years       BP:           128/98 mmHg Patient Gender: F              HR:           108 bpm. Exam Location:  [facility] Procedure: 2D Echo Indications:    Atrial Fibrillation 427.31 / I48.91  History:        Patient has no prior history of Echocardiogram examinations.                 Arrythmias:Atrial Fibrillation; Risk Factors:Dyslipidemia,                 Hypertension and Non-Smoker. Food impaction of esophagus,                 PULMONARY EMBOLISM, HX OF, ARF (acute renal failure.  Sonographer:    Leavy Cella RDCS (AE) Referring Phys: [id number] [place]  1. Left ventricular ejection fraction, by visual estimation, is 65 to 70%. The left ventricle has hyperdynamic function. There is mildly increased left ventricular hypertrophy.  2. Left ventricular  diastolic function could not be evaluated.  3. The left ventricle has no regional wall motion abnormalities.  4. Global right ventricle has normal systolic function.The right ventricular size is normal. No increase in right ventricular wall thickness.  5. Left atrial size was normal.  6. Right atrial size was normal.  7. The mitral valve is grossly normal. No evidence of mitral valve regurgitation.  8. The tricuspid valve is grossly normal.  9. The tricuspid valve is grossly normal. Tricuspid valve regurgitation is trivial. 10. The aortic valve is tricuspid. Aortic valve regurgitation is not visualized. 11. The pulmonic valve was grossly normal. Pulmonic valve regurgitation is not visualized. 12. The inferior vena cava is normal in size with greater than 50% respiratory variability, suggesting right atrial pressure of 3 mmHg. FINDINGS  Left Ventricle: Left ventricular ejection fraction, by visual estimation, is 65 to 70%. The left ventricle has hyperdynamic function. The left ventricle has no regional wall motion abnormalities. The left ventricular internal cavity size was the left ventricle is normal in size. There is mildly increased left ventricular hypertrophy. Concentric left ventricular hypertrophy. The left ventricular diastology could not be evaluated due to atrial fibrillation. Left ventricular diastolic function could not  be evaluated. Right Ventricle: The right ventricular size is normal. No increase in right ventricular wall thickness. Global RV systolic function is has normal systolic function. Left Atrium: Left atrial size was normal in size. Right Atrium: Right atrial size was normal in size Pericardium: There is no evidence of pericardial effusion. Mitral Valve: The mitral valve is grossly normal. No evidence of mitral valve regurgitation. Tricuspid Valve: The tricuspid valve is grossly normal. Tricuspid valve regurgitation is trivial. Aortic Valve: The aortic valve is tricuspid. Aortic valve  regurgitation is not visualized. Pulmonic Valve: The pulmonic valve was grossly normal. Pulmonic valve regurgitation is not visualized. Pulmonic regurgitation is not visualized. Aorta: The aortic root is normal in size and structure. Venous: The inferior vena cava is normal in size with greater than 50% respiratory variability, suggesting right atrial pressure of 3 mmHg. IAS/Shunts: No  atrial level shunt detected by color flow Doppler.  LEFT VENTRICLE PLAX 2D LVIDd:         3.58 cm  Diastology LVIDs:         1.66 cm  LV e' lateral:   7.30 cm/s LV PW:         1.04 cm  LV E/e' lateral: 9.8 LV IVS:        1.18 cm  LV e' medial:    9.09 cm/s LVOT diam:     2.00 cm  LV E/e' medial:  7.9 LV SV:         46 ml LV SV Index:   27.38 LVOT Area:     3.14 cm  RIGHT VENTRICLE RV S prime:     17.90 cm/s TAPSE (M-mode): 2.3 cm LEFT ATRIUM             Index       RIGHT ATRIUM           Index LA diam:        2.90 cm 1.72 cm/m  RA Area:     13.00 cm LA Vol (A2C):   22.2 ml 13.13 ml/m RA Volume:   27.00 ml  15.97 ml/m LA Vol (A4C):   32.9 ml 19.46 ml/m LA Biplane Vol: 29.6 ml 17.51 ml/m   AORTA Ao Root diam: 2.80 cm MITRAL VALVE MV Area (PHT): 2.48 cm              SHUNTS MV PHT:        88.74 msec            Systemic Diam: 2.00 cm MV Decel Time: 306 msec MV E velocity: 71.60 cm/s  103 cm/s MV A velocity: 103.00 cm/s 70.3 cm/s MV E/A ratio:  0.70        1.5  Kate Sable [name] Electronically signed by Kate Sable [name] Signature Date/Time: 12/21/2019/11:35:10 AM    Final    Scheduled Meds: . metoprolol tartrate  2.5 mg Intravenous Q8H  . pantoprazole (PROTONIX) IV  40 mg Intravenous Q24H  . sodium chloride flush  3 mL Intravenous Q12H   Continuous Infusions: . sodium chloride 100 mL/hr at 12/22/19 0745  . sodium chloride      Principal Problem:   Food impaction of esophagus Active Problems:   PULMONARY EMBOLISM, HX OF   Hypokalemia   Dementia (HCC)   Hyperglycemia   Hyponatremia   Hypomagnesemia   New  onset atrial fibrillation ([place])   Encounter for anticoagulation discussion and counseling   Essential hypertension   Preoperative clearance   Time spent:   Irwin Brakeman, [name] [facility] 12/22/2019, 12:06 PM    LOS: 0 days  How to contact the [facility] Attending or Consulting provider [place] or covering provider during after hours [place], for this patient?  1. Check the care team in [facility] and look for a) attending/consulting [facility] provider listed and b) the [facility] team listed 2. Log into [email or web address] and use [facility]'s universal password to access. If you do not have the password, please contact the hospital operator. 3. Locate the [facility] provider you are looking for under [facility] and page to a number that you can be directly reached. 4. If you still have difficulty reaching the provider, please page the [facility] (Director on Call) for the Hospitalists listed on amion for assistance.

## 2019-12-23 DIAGNOSIS — F0390 Unspecified dementia without behavioral disturbance: Secondary | ICD-10-CM

## 2019-12-23 DIAGNOSIS — E871 Hypo-osmolality and hyponatremia: Secondary | ICD-10-CM

## 2019-12-23 DIAGNOSIS — R339 Retention of urine, unspecified: Secondary | ICD-10-CM

## 2019-12-23 DIAGNOSIS — R1314 Dysphagia, pharyngoesophageal phase: Secondary | ICD-10-CM

## 2019-12-23 DIAGNOSIS — I4891 Unspecified atrial fibrillation: Secondary | ICD-10-CM

## 2019-12-23 LAB — GLUCOSE, CAPILLARY
Glucose-Capillary: 104 mg/dL — ABNORMAL HIGH (ref 70–99)
Glucose-Capillary: 105 mg/dL — ABNORMAL HIGH (ref 70–99)
Glucose-Capillary: 105 mg/dL — ABNORMAL HIGH (ref 70–99)
Glucose-Capillary: 113 mg/dL — ABNORMAL HIGH (ref 70–99)
Glucose-Capillary: 85 mg/dL (ref 70–99)
Glucose-Capillary: 94 mg/dL (ref 70–99)

## 2019-12-23 LAB — BASIC METABOLIC PANEL
Anion gap: 10 (ref 5–15)
BUN: 9 mg/dL (ref 8–23)
CO2: 19 mmol/L — ABNORMAL LOW (ref 22–32)
Calcium: 8.2 mg/dL — ABNORMAL LOW (ref 8.9–10.3)
Chloride: 96 mmol/L — ABNORMAL LOW (ref 98–111)
Creatinine, Ser: 0.63 mg/dL (ref 0.44–1.00)
GFR calc Af Amer: >60 mL/min (ref >60)
GFR calc non Af Amer: >60 mL/min (ref >60)
Glucose, Bld: 116 mg/dL — ABNORMAL HIGH (ref 70–99)
Potassium: 4 mmol/L (ref 3.5–5.1)
Sodium: 125 mmol/L — ABNORMAL LOW (ref 135–145)

## 2019-12-23 LAB — MAGNESIUM: Magnesium: 2 mg/dL (ref 1.7–2.4)

## 2019-12-23 LAB — CBC
HCT: 33.7 % — ABNORMAL LOW (ref 36.0–46.0)
Hemoglobin: 11.5 g/dL — ABNORMAL LOW (ref 12.0–15.0)
MCH: 31.9 pg (ref 26.0–34.0)
MCHC: 34.1 g/dL (ref 30.0–36.0)
MCV: 93.4 fL (ref 80.0–100.0)
Platelets: 234 10*3/uL (ref 150–400)
RBC: 3.61 MIL/uL — ABNORMAL LOW (ref 3.87–5.11)
RDW: 12.5 % (ref 11.5–15.5)
WBC: 10.9 10*3/uL — ABNORMAL HIGH (ref 4.0–10.5)
nRBC: 0 % (ref 0.0–0.2)

## 2019-12-23 MED ORDER — PANTOPRAZOLE SODIUM 40 MG PO TBEC
40.0000 mg | DELAYED_RELEASE_TABLET | Freq: Every day | ORAL | Status: DC
  Administered 2019-12-24: 40 mg via ORAL
  Filled 2019-12-23: qty 1

## 2019-12-23 MED ORDER — APIXABAN 2.5 MG PO TABS
2.5000 mg | ORAL_TABLET | Freq: Two times a day (BID) | ORAL | Status: DC
  Administered 2019-12-23 – 2019-12-24 (×3): 2.5 mg via ORAL
  Filled 2019-12-23 (×3): qty 1

## 2019-12-23 NOTE — Progress Notes (Signed)
Subjective:  Patient has no complaints.  Patient's son [name] is at bedside.  He states she has been taking sips of liquids and keeping it down.  According to nursing staff she was able to swallow pills without any difficulty.  Patient's sons tells me that since her last esophageal dilation she may have had 3 or so episodes of food impaction which she feels may be due to anxiety or stress.  Since her episodes have been infrequent he felt that we should watch.  Therefore esophagogastroduodenoscopy was canceled.  Objective: Blood pressure 139/79, pulse (!) 109, temperature 98.2 F (36.8 C), temperature source Oral, resp. rate 18, height '5\' 7"'  (1.702 m), weight 59.5 kg, SpO2 99 %. Patient is alert.  She appears to be comfortable in bed. Her abdomen is soft.  Bladder is distended.  Review of her breakfast tray reveals that she has eaten very little.  Labs/studies Results:  CBC Latest Ref Rng & Units 12/23/2019 12/22/2019 12/21/2019  WBC 4.0 - 10.5 K/uL 10.9(H) 15.0(H) 5.0  Hemoglobin 12.0 - 15.0 g/dL 11.5(L) 11.5(L) 11.2(L)  Hematocrit 36.0 - 46.0 % 33.7(L) 33.1(L) 31.1(L)  Platelets 150 - 400 K/uL 234 229 215    CMP Latest Ref Rng & Units 12/23/2019 12/22/2019 12/21/2019  Glucose 70 - 99 mg/dL 116(H) 117(H) 127(H)  BUN 8 - 23 mg/dL '9 8 8  ' Creatinine 0.44 - 1.00 mg/dL 0.63 0.53 0.50  Sodium 135 - 145 mmol/L 125(L) 117(LL) 112(LL)  Potassium 3.5 - 5.1 mmol/L 4.0 4.2 4.2  Chloride 98 - 111 mmol/L 96(L) 89(L) 84(L)  CO2 22 - 32 mmol/L 19(L) 18(L) 18(L)  Calcium 8.9 - 10.3 mg/dL 8.2(L) 7.4(L) 7.3(L)  Total Protein 6.5 - 8.1 g/dL - - -  Total Bilirubin 0.3 - 1.2 mg/dL - - -  Alkaline Phos 38 - 126 U/L - - -  AST 15 - 41 U/L - - -  ALT 14 - 54 U/L - - -    Hepatic Function Latest Ref Rng & Units 12/22/2019 04/04/2018 10/05/2011  Total Protein 6.5 - 8.1 g/dL - 7.5 7.5  Albumin 3.5 - 5.0 g/dL 3.1(L) 3.7 4.2  AST 15 - 41 U/L - 21 18  ALT 14 - 54 U/L - 11(L) 8  Alk Phosphatase 38 - 126 U/L - 91 64  Total  Bilirubin 0.3 - 1.2 mg/dL - 0.6 0.3      Assessment:  #1.  Esophageal dysphagia.  Patient presented with what appears to be food impaction with spontaneous relief.  She was scheduled to undergo esophagogastroduodenoscopy with dilation by Dr. Gala Romney but it was canceled as requested by her son [name].  She has history of distal esophageal stricture was last dilated by Dr. Penelope Coop in June 2017. Patient is tolerating full liquids although intake does not appear to be adequate.  #2.  Hyponatremia felt to be due to diuretic therapy.  Serum sodium continues to improve gradually.  #3.  New onset atrial fibrillation.  Patient was seen by Dr. Bronson Ing of cardiology.  Patient is back on beta-blocker and heart rate is down.  #4.  Dementia.  Patient lives at home with her son [name].  She has been ambulating with walker and able to verbalize her needs.  #5.  Distended urinary bladder.  We will asked nursing staff to do the Doppler and do in and out cath unless patient able to void.  Recommendations  Patient will continue on full liquid diet on mechanical soft diet when she is discharged. She is to  follow-up with Dr. Penelope Coop on an outpatient basis.  We will sign off.

## 2019-12-23 NOTE — Progress Notes (Addendum)
PROGRESS NOTE [name]   [name]  [id number]  DOB: 07-Sep-1926  DOA: 12/21/2019 PCP: Iona Beard, MD   Brief Admission Hx: 86 y.o. female with PMHx of  HTN, HLD, prior espophageal stricture previously requiring EGD with dilatation,, dementia and remote history of PE (occurring in 2005) who presents with intractable emesis and inability to swallow since 12/20/2019.  She was noted to have severe hyponatremia and dehydration.  She was in atrial fibrillation with RVR.  She was seen by cardiology.  Plan is for her to have EGD to 12/22/19.   MDM/Assessment & Plan:   1. Esophageal food impaction-patient has been seen by GI and it appears that the obstruction spontaneously dislodged and patient's son requested not to proceed with EGD/dilation.  Pt will remain on liquid diet and have outpatient follow up with Dr. Oneida Alar for elective procedure.   2. New onset A. fib with RVR.  The patient has been seen by cardiology and recommendations are noted.  She is on metoprolol.  Her rate is controlled now.  Follow-up TSH.  Anticoagulation per cardiology recommendations can resume now that GI workup is complete and no procedures anticipated.  Apixaban per pharmacy requested. 3. Hyponatremia-suspect secondary to HCTZ use and poor oral intake.  The patient has been given IV fluid hydration and her sodium has slowly been improving.  She is feeling better.  Her sodium is 117.  Continue normal saline infusion.  Recheck BMP in a.m.  Continue to hold HCTZ. 4. Hyperglycemia-A1c 5.7% c/w prediabetes, manage with diet.  5. Social: Patient lives with son and has underlying dementia.  Son would like patient returned home with him.  HHPT ordered per PT recommendations.   DVT prophylaxis: SCDs Code Status: Full Family Communication: Son at bedside Disposition Plan: Continue IV fluids, hopeful for home tomorrow if sodium remains stable.    Consultants:  GI  Procedures:  EGD deferred 12/22/2019 per son's  request  Antimicrobials:     Subjective: Patient says she feels stronger.  Son remains at bedside.   Objective: Vitals:   12/22/19 2300 12/23/19 0439 12/23/19 1322 12/23/19 1351  BP:  139/79  115/62  Pulse: 100 (!) 109  90  Resp:  18  17  Temp:  98.2 F (36.8 C)  98.2 F (36.8 C)  TempSrc:  Oral  Oral  SpO2:  99% 98% 100%  Weight:      Height:        Intake/Output Summary (Last 24 hours) at 12/23/2019 1414 Last data filed at 12/23/2019 0500 Gross per 24 hour  Intake --  Output 300 ml  Net -300 ml   Filed Weights   12/21/19 0517 12/21/19 1029  Weight: 62.6 kg 59.5 kg   REVIEW OF SYSTEMS  As per history otherwise all reviewed and reported negative  Exam:  General exam: Elderly female lying in bed she is awake and alert in no distress.  Pt has dementia.  Respiratory system: Clear. No increased work of breathing. Cardiovascular system: S1 & S2 heard. No JVD, murmurs, gallops, clicks or pedal edema. Gastrointestinal system: Abdomen is nondistended, soft and nontender. Normal bowel sounds heard. Central nervous system: Alert and oriented to person. No focal neurological deficits. Extremities: no CCE.  Data Reviewed: Basic Metabolic Panel: Recent Labs  Lab 12/21/19 0609 12/21/19 0609 12/21/19 1204 12/21/19 1740 12/21/19 2325 12/22/19 0608 12/23/19 0536  NA 111*   < > 112* 112* 112* 117* 125*  K 2.3*   < > 2.9* 3.5 4.2 4.2  4.0  CL 74*   < > 80* 81* 84* 89* 96*  CO2 20*   < > 21* 21* 18* 18* 19*  GLUCOSE 245*   < > 205* 139* 127* 117* 116*  BUN 7*   < > 7* 8 8 8 9   CREATININE 0.53   < > 0.45 0.46 0.50 0.53 0.63  CALCIUM 8.1*   < > 7.7* 7.4* 7.3* 7.4* 8.2*  MG 1.0*  --   --  2.4  --  2.0 2.0  PHOS  --   --   --   --   --  1.6*  --    < > = values in this interval not displayed.   Liver Function Tests: Recent Labs  Lab 12/22/19 0608  ALBUMIN 3.1*   No results for input(s): LIPASE, AMYLASE in the last 168 hours. No results for input(s): AMMONIA in the  last 168 hours. CBC: Recent Labs  Lab 12/21/19 0609 12/22/19 0608 12/23/19 0536  WBC 5.0 15.0* 10.9*  HGB 11.2* 11.5* 11.5*  HCT 31.1* 33.1* 33.7*  MCV 92.6 91.7 93.4  PLT 215 229 234   Cardiac Enzymes: No results for input(s): CKTOTAL, CKMB, CKMBINDEX, TROPONINI in the last 168 hours. CBG (last 3)  Recent Labs    12/23/19 0439 12/23/19 0740 12/23/19 1114  GLUCAP 104* 105* 105*   Recent Results (from the past 240 hour(s))  Respiratory Panel by RT PCR (Flu A&B, Covid) - Nasopharyngeal Swab     Status: None   Collection Time: 12/21/19  5:33 AM   Specimen: Nasopharyngeal Swab  Result Value Ref Range Status   SARS Coronavirus 2 by RT PCR NEGATIVE NEGATIVE Final    Comment: (NOTE) SARS-CoV-2 target nucleic acids are NOT DETECTED. The SARS-CoV-2 RNA is generally detectable in upper respiratoy specimens during the acute phase of infection. The lowest concentration of SARS-CoV-2 viral copies this assay can detect is 131 copies/mL. A negative result does not preclude SARS-Cov-2 infection and should not be used as the sole basis for treatment or other patient management decisions. A negative result may occur with  improper specimen collection/handling, submission of specimen other than nasopharyngeal swab, presence of viral mutation(s) within the areas targeted by this assay, and inadequate number of viral copies (<131 copies/mL). A negative result must be combined with clinical observations, patient history, and epidemiological information. The expected result is Negative. Fact Sheet for Patients:  PinkCheek.be Fact Sheet for Healthcare Providers:  GravelBags.it This test is not yet ap proved or cleared by the [place] FDA and  has been authorized for detection and/or diagnosis of SARS-CoV-2 by FDA under an Emergency Use Authorization (EUA). This EUA will remain  in effect (meaning this test can be used) for the  duration of the COVID-19 declaration under Section 564(b)(1) of the Act, 21 U.S.C. section 360bbb-3(b)(1), unless the authorization is terminated or revoked sooner.    Influenza A by PCR NEGATIVE NEGATIVE Final   Influenza B by PCR NEGATIVE NEGATIVE Final    Comment: (NOTE) The Xpert Xpress SARS-CoV-2/FLU/RSV assay is intended as an aid in  the diagnosis of influenza from Nasopharyngeal swab specimens and  should not be used as a sole basis for treatment. Nasal washings and  aspirates are unacceptable for Xpert Xpress SARS-CoV-2/FLU/RSV  testing. Fact Sheet for Patients: PinkCheek.be Fact Sheet for Healthcare Providers: GravelBags.it This test is not yet approved or cleared by the [place] FDA and  has been authorized for detection and/or diagnosis of SARS-CoV-2 by  FDA under an Emergency Use Authorization (EUA). This EUA will remain  in effect (meaning this test can be used) for the duration of the  Covid-19 declaration under Section 564(b)(1) of the Act, 21  U.S.C. section 360bbb-3(b)(1), unless the authorization is  terminated or revoked. Performed at [facility], [address] [place], [place] [address]      Studies: No results found. Scheduled Meds: . memantine  10 mg Oral Daily  . metoprolol succinate  50 mg Oral Daily  . pantoprazole (PROTONIX) IV  40 mg Intravenous Q24H  . sodium chloride flush  3 mL Intravenous Q12H   Continuous Infusions: . sodium chloride 100 mL/hr at 12/22/19 2230  . sodium chloride      Principal Problem:   Food impaction of esophagus Active Problems:   PULMONARY EMBOLISM, HX OF   Hypokalemia   Dementia (HCC)   Hyperglycemia   Hyponatremia   Hypomagnesemia   New onset atrial fibrillation ([place])   Encounter for anticoagulation discussion and counseling   Essential hypertension   Preoperative clearance  Time spent:   Irwin Brakeman, MD [facility] 12/23/2019,  2:14 PM    LOS: 1 day  How to contact the [facility] Attending or Consulting provider [place] or covering provider during after hours [place], for this patient?  1. Check the care team in [facility] and look for a) attending/consulting TR[facility] provider listed and b) the [facility] team listed 2. Log into [email or web address] and use Ruth's universal password to access. If you do not have the password, please contact the hospital operator. 3. Locate the [facility] provider you are looking for under [facility] and page to a number that you can be directly reached. 4. If you still have difficulty reaching the provider, please page the [facility] (Director on Call) for the Hospitalists listed on amion for assistance.

## 2019-12-23 NOTE — Progress Notes (Signed)
ANTICOAGULATION CONSULT NOTE - Initial Consult  Pharmacy Consult for Eliquis(apixaban) Indication: atrial fibrillation  Allergies  Allergen Reactions  . Erythromycin Other (See Comments)    Reaction unknown    Patient Measurements: Height: 5\' 7"  (170.2 cm) Weight: 131 lb 2.8 oz (59.5 kg) IBW/kg (Calculated) : 61.6  Vital Signs: Temp: 98.2 F (36.8 C) (02/06 1351) Temp Source: Oral (02/06 1351) BP: 115/62 (02/06 1351) Pulse Rate: 90 (02/06 1351)  Labs: Recent Labs    12/21/19 0609 12/21/19 0609 12/21/19 1204 12/21/19 2325 12/22/19 0608 12/23/19 0536  HGB 11.2*   < >  --   --  11.5* 11.5*  HCT 31.1*  --   --   --  33.1* 33.7*  PLT 215  --   --   --  229 234  CREATININE 0.53  --    < > 0.50 0.53 0.63   < > = values in this interval not displayed.    Estimated Creatinine Clearance: 43 mL/min (by C-G formula based on SCr of 0.63 mg/dL).   Medical History: Past Medical History:  Diagnosis Date  . Anxiety   . Dementia, senile, with, delirium 10/06/2011  . Depression   . Diverticulosis    Colon   . Hepatic hemangioma   . Hx pulmonary embolism 2005  . Hyperglycemia 10/06/2011  . Hyperlipidemia   . Hypertension   . Normocytic anemia   . Osteopenia 10/08/2003   Dx   . Polypharmacy     Medications:  Medications Prior to Admission  Medication Sig Dispense Refill Last Dose  . acetaminophen (TYLENOL) 500 MG tablet Take 500 mg by mouth every 6 (six) hours as needed.   12/20/2019 at Unknown time  . hydrochlorothiazide (HYDRODIURIL) 25 MG tablet Take 25 mg by mouth daily.    12/20/2019 at Unknown time  . HYDROcodone-acetaminophen (NORCO/VICODIN) 5-325 MG tablet TAKE 1 TABLET BY MOUTH EVERY 8 HOURS AS NEEDED FOR MODERATE PAIN. 90 tablet 0 12/20/2019 at Unknown time  . memantine (NAMENDA) 10 MG tablet Take 10 mg by mouth daily.    12/20/2019 at Unknown time  . metoprolol succinate (TOPROL-XL) 50 MG 24 hr tablet Take 50 mg by mouth daily.   12/20/2019 at Unknown time  .  omeprazole (PRILOSEC) 20 MG capsule Take 20 mg by mouth daily.      Marland Kitchen XANAX 0.25 MG tablet Take 0.25 mg by mouth daily as needed.   12/20/2019 at Unknown time    Assessment: New onset A. fib with RVR.  The patient has been seen by cardiology and recommendations to start eliquis once cleared by GI.(cleared now) Pharmacy asked to dose eliquis. 59.5kg 86 yo,  Goal of Therapy:  Monitor platelets by anticoagulation protocol: Yes   Plan:  Eliquis 2.5mg  po bid Educate on eliquis Monitor for S/S of bleeding  Isac Sarna, BS [place], BCPS Clinical Pharmacist Pager [phone] 12/23/2019,2:41 PM

## 2019-12-23 NOTE — Progress Notes (Signed)
Patient was bladder scanned and revealed greater than 999. Patient was moved to bedside commode and able to void approximately 2109ml of urine. In and out cath patient per Dr. Olevia Perches verbal order and 1267ml was returned. Patient tolerated well with no complaints.

## 2019-12-24 DIAGNOSIS — R339 Retention of urine, unspecified: Secondary | ICD-10-CM

## 2019-12-24 DIAGNOSIS — F0390 Unspecified dementia without behavioral disturbance: Secondary | ICD-10-CM

## 2019-12-24 DIAGNOSIS — Z86718 Personal history of other venous thrombosis and embolism: Secondary | ICD-10-CM

## 2019-12-24 DIAGNOSIS — T18128D Food in esophagus causing other injury, subsequent encounter: Secondary | ICD-10-CM

## 2019-12-24 LAB — GLUCOSE, CAPILLARY
Glucose-Capillary: 85 mg/dL (ref 70–99)
Glucose-Capillary: 91 mg/dL (ref 70–99)
Glucose-Capillary: 97 mg/dL (ref 70–99)

## 2019-12-24 LAB — BASIC METABOLIC PANEL
Anion gap: 8 (ref 5–15)
BUN: 8 mg/dL (ref 8–23)
CO2: 22 mmol/L (ref 22–32)
Calcium: 8.2 mg/dL — ABNORMAL LOW (ref 8.9–10.3)
Chloride: 100 mmol/L (ref 98–111)
Creatinine, Ser: 0.54 mg/dL (ref 0.44–1.00)
GFR calc Af Amer: >60 mL/min (ref >60)
GFR calc non Af Amer: >60 mL/min (ref >60)
Glucose, Bld: 90 mg/dL (ref 70–99)
Potassium: 3.2 mmol/L — ABNORMAL LOW (ref 3.5–5.1)
Sodium: 130 mmol/L — ABNORMAL LOW (ref 135–145)

## 2019-12-24 MED ORDER — POTASSIUM CHLORIDE ER 10 MEQ PO TBCR: 10.0000 meq | EXTENDED_RELEASE_TABLET | Freq: Every day | ORAL | 0 refills | Status: AC

## 2019-12-24 MED ORDER — OMEPRAZOLE 20 MG PO CPDR: 20.0000 mg | DELAYED_RELEASE_CAPSULE | Freq: Every day | ORAL | 2 refills | Status: AC

## 2019-12-24 MED ORDER — POTASSIUM CHLORIDE 20 MEQ/15ML (10%) PO SOLN: 40.0000 meq | Freq: Once | ORAL | Status: DC

## 2019-12-24 MED ORDER — APIXABAN 2.5 MG PO TABS: 2.5000 mg | ORAL_TABLET | Freq: Two times a day (BID) | ORAL | 0 refills | Status: AC

## 2019-12-24 NOTE — TOC Transition Note (Signed)
Transition of Care [facility]) - CM/SW Discharge Note   Patient Details  Name: [name] MRN: [id number] Date of Birth: [birth year]  [facility] of [facility]) CM/SW Contact:  Boneta Lucks, RN Phone Number: 12/24/2019, 10:58 AM   Clinical Narrative:   Patient discharging home today. PT is recommending HHPT. CM spoke with son, he is agreeable and  Tax inspector for education. Patient has new onset AFIB. Discussed choices, they had used [facility] in the past. Immunologist with [facility] he accepted the referral for PT and RN. Information added to AVS.    Final next level of care: [place] Barriers to Discharge: Barriers Resolved   Patient Goals and CMS Choice Patient states their goals for this hospitalization and ongoing recovery are:: to go home. CMS Medicare.gov Compare Post Acute Care list provided to:: Patient Represenative (must comment) Choice offered to / list presented to : Adult Children  Discharge Placement                Patient to be transferred to facility by: Son Name of family member notified: Alease Frame - son Patient and family notified of of transfer: 12/24/19  Discharge Plan and Services      HH Arranged: PT, RN [facility] Agency: [facility] [facility] [employer] Date [place]: 12/24/19 Time [place]: 1056 Representative spoke with at [place]: [place]   Readmission Risk Interventions No flowsheet data found.

## 2019-12-24 NOTE — Progress Notes (Signed)
Discharge instructions given to patient and son, Alease Frame. Verbalized understanding of follow up appointnents and new medications.  [place] care reviewed with son.

## 2019-12-24 NOTE — Discharge Summary (Signed)
Physician Discharge Summary  Karysa Kassam [id number] DOB: 1926/05/25 DOA: 12/21/2019  PCP: Iona Beard, MD  Admit date: 12/21/2019 Discharge date: 12/24/2019  Admitted From:  Home  Disposition:  Home with [facility] and 24/7 observation  Recommendations for Outpatient Follow-up:  1. Follow up with PCP in 2 weeks 2. Follow up with [facility] in 1-2 weeks for voiding trial, foley removal 3. Follow up with cardiology on 2/18 as scheduled.  4. Please obtain BMP/CBC in 1-2 weeks   Home Health:  RN, PT   Discharge Condition: STABLE   CODE STATUS: FULL    Brief Hospitalization Summary: Please see all hospital notes, images, labs for full details of the hospitalization. ADMISSION HPI:  Shandel Briegel  is a 86 y.o. female with PMHx of  HTN, HLD, prior espophageal stricture previously requiring EGD with dilatation,, dementia and remote history of PE (occurring in 2005) who presents with intractable emesis and inability to swallow since 12/20/2019 -Patient apparently atypical, felt choked, has been having difficulty swallowing since then with dry heaves and emesis noted when able to swallow saliva -- In the ED glucagon administered with no relief -Additional history obtained from patient's son at bedside, patient has underlying dementia with cognitive deficits and she is a poor historian -In ED patient found to be tachycardic with EKG suggesting A. fib with RVR -Labs revealed magnesium of 1.0, potassium of 2.3 and sodium of 111 -GI consult requested by EDP -Cardiology consult requested by anesthesia and GI service  Brief Admission Hx: 86 y.o.femalewith PMHx ofHTN, HLD, prior espophageal stricturepreviously requiring EGD with dilatation,, dementia and remote history of PE (occurring in 2005)who presents with intractable emesis and inability to swallow since 12/20/2019.  She was noted to have severe hyponatremia and dehydration.  She was in atrial fibrillation with RVR.  She was seen by  cardiology.  Plan is for her to have EGD to 12/22/19.   MDM/Assessment & Plan:   1. Esophageal food impaction-patient has been seen by GI and it appears that the obstruction spontaneously dislodged and patient's son requested not to proceed with EGD/dilation.  Pt will remain on liquid diet and have outpatient follow up with Dr. Oneida Alar for elective procedure.   2. New onset persistent A. fib with RVR.  HR is now controlled.   The patient has been seen by cardiology and recommendations are noted.  She is on metoprolol.  Her rate is controlled now.  Follow-up TSH.  Anticoagulation per cardiology recommendations can resume now that GI workup is complete and no procedures anticipated.  Apixaban per pharmacy dose 2.5 mg BID. 3. Urinary Retention - persistent, likely secondary to advanced age.  After 24 hours of requiring I/O caths with large amounts of retained urine, Foley was placed at DC with son's permission.  Outpatient follow up with [facility] recommended for Voiding Trial / Foley Removal in 1-2 weeks.   4. Hyponatremia-suspect secondary to HCTZ use and poor oral intake.  The patient has been given IV fluid hydration and her sodium has slowly been improving.  She is feeling better.  Her sodium was initially 117 and slowly improved to 130 and patient feeling much better.  Discontinued HCTZ. 5. Hyperglycemia-A1c 5.7% c/w prediabetes, managed with diet.  6. Social: Patient lives with son and has underlying dementia.  Son would like patient returned home with him.  HHPT ordered per PT and RN recommendations.   DVT prophylaxis: SCDs Code Status: Full Family Communication: Son at bedside Disposition Plan: Home   Consultants:  GI  Procedures:  EGD deferred 12/22/2019 per son's request  Discharge Diagnoses:  Principal Problem:   Food impaction of esophagus Active Problems:   PULMONARY EMBOLISM, HX OF   Hypokalemia   Dementia (HCC)   Hyperglycemia   Hyponatremia   Hypomagnesemia    New onset atrial fibrillation ([place])   Encounter for anticoagulation discussion and counseling   Essential hypertension   Preoperative clearance   Urinary retention   Discharge Instructions:  Allergies as of 12/24/2019      Reactions   Erythromycin Other (See Comments)   Reaction unknown      Medication List    STOP taking these medications   hydrochlorothiazide 25 MG tablet Commonly known as: HYDRODIURIL     TAKE these medications   acetaminophen 500 MG tablet Commonly known as: TYLENOL Take 500 mg by mouth every 6 (six) hours as needed.   apixaban 2.5 MG Tabs tablet Commonly known as: ELIQUIS Take 1 tablet (2.5 mg total) by mouth 2 (two) times daily.   HYDROcodone-acetaminophen 5-325 MG tablet Commonly known as: NORCO/VICODIN TAKE 1 TABLET BY MOUTH EVERY 8 HOURS AS NEEDED FOR MODERATE PAIN.   memantine 10 MG tablet Commonly known as: NAMENDA Take 10 mg by mouth daily.   metoprolol succinate 50 MG 24 hr tablet Commonly known as: TOPROL-XL Take 50 mg by mouth daily.   omeprazole 20 MG capsule Commonly known as: PRILOSEC Take 1 capsule (20 mg total) by mouth daily.   potassium chloride 10 MEQ tablet Commonly known as: KLOR-CON Take 1 tablet (10 mEq total) by mouth daily for 5 days.   Xanax 0.25 MG tablet Generic drug: ALPRAZolam Take 0.25 mg by mouth daily as needed.      Follow-up Information    Erma Heritage, PA-C Follow up on 01/04/2020.   Specialties: Physician Assistant, Cardiology Why: Cardiology Hospital Follow Contact information: [place] [address] [phone]        Iona Beard, MD. Schedule an appointment as soon as possible for a visit in 2 week(s).   Specialty: Family Medicine Why: Hospital Follow Up  Contact information: [address] [place] [address] [phone]        [facility] [place]. Schedule an appointment as soon as possible for a visit in 1 week(s).   Specialty:  Urology Why: Hospital Follow Up for Voiding Trial, Foley Removal  Contact information: [phone]       [facility] Follow up.   Specialty: Home Health Services Why:   PT/ RN Contact information: [address] [place] [address] [phone]          Allergies  Allergen Reactions  . Erythromycin Other (See Comments)    Reaction unknown   Allergies as of 12/24/2019      Reactions   Erythromycin Other (See Comments)   Reaction unknown      Medication List    STOP taking these medications   hydrochlorothiazide 25 MG tablet Commonly known as: HYDRODIURIL     TAKE these medications   acetaminophen 500 MG tablet Commonly known as: TYLENOL Take 500 mg by mouth every 6 (six) hours as needed.   apixaban 2.5 MG Tabs tablet Commonly known as: ELIQUIS Take 1 tablet (2.5 mg total) by mouth 2 (two) times daily.   HYDROcodone-acetaminophen 5-325 MG tablet Commonly known as: NORCO/VICODIN TAKE 1 TABLET BY MOUTH EVERY 8 HOURS AS NEEDED FOR MODERATE PAIN.   memantine 10 MG tablet Commonly  known as: NAMENDA Take 10 mg by mouth daily.   metoprolol succinate 50 MG 24 hr tablet Commonly known as: TOPROL-XL Take 50 mg by mouth daily.   omeprazole 20 MG capsule Commonly known as: PRILOSEC Take 1 capsule (20 mg total) by mouth daily.   potassium chloride 10 MEQ tablet Commonly known as: KLOR-CON Take 1 tablet (10 mEq total) by mouth daily for 5 days.   Xanax 0.25 MG tablet Generic drug: ALPRAZolam Take 0.25 mg by mouth daily as needed.       Procedures/Studies: ECHOCARDIOGRAM COMPLETE  Result Date: 12/21/2019   ECHOCARDIOGRAM REPORT   Patient Name:   AARIA RESPESS Date of Exam: 12/21/2019 Medical Rec #:  [id number]      Height:       67.0 in Accession #:    [id number]     Weight:       131.2 lb Date of Birth:  Oct 27, 1926      BSA:          1.69 m Patient Age:    86 years       BP:           128/98 mmHg Patient Gender: F              HR:           108  bpm. Exam Location:  [facility] Procedure: 2D Echo Indications:    Atrial Fibrillation 427.31 / I48.91  History:        Patient has no prior history of Echocardiogram examinations.                 Arrythmias:Atrial Fibrillation; Risk Factors:Dyslipidemia,                 Hypertension and Non-Smoker. Food impaction of esophagus,                 PULMONARY EMBOLISM, HX OF, ARF (acute renal failure.  Sonographer:    Leavy Cella RDCS (AE) Referring Phys: [id number] [place]  1. Left ventricular ejection fraction, by visual estimation, is 65 to 70%. The left ventricle has hyperdynamic function. There is mildly increased left ventricular hypertrophy.  2. Left ventricular diastolic function could not be evaluated.  3. The left ventricle has no regional wall motion abnormalities.  4. Global right ventricle has normal systolic function.The right ventricular size is normal. No increase in right ventricular wall thickness.  5. Left atrial size was normal.  6. Right atrial size was normal.  7. The mitral valve is grossly normal. No evidence of mitral valve regurgitation.  8. The tricuspid valve is grossly normal.  9. The tricuspid valve is grossly normal. Tricuspid valve regurgitation is trivial. 10. The aortic valve is tricuspid. Aortic valve regurgitation is not visualized. 11. The pulmonic valve was grossly normal. Pulmonic valve regurgitation is not visualized. 12. The inferior vena cava is normal in size with greater than 50% respiratory variability, suggesting right atrial pressure of 3 mmHg. FINDINGS  Left Ventricle: Left ventricular ejection fraction, by visual estimation, is 65 to 70%. The left ventricle has hyperdynamic function. The left ventricle has no regional wall motion abnormalities. The left ventricular internal cavity size was the left ventricle is normal in size. There is mildly increased left ventricular hypertrophy. Concentric left ventricular hypertrophy. The left ventricular  diastology could not be evaluated due to atrial fibrillation. Left ventricular diastolic function could not  be evaluated. Right Ventricle: The right ventricular size is normal. No increase  in right ventricular wall thickness. Global RV systolic function is has normal systolic function. Left Atrium: Left atrial size was normal in size. Right Atrium: Right atrial size was normal in size Pericardium: There is no evidence of pericardial effusion. Mitral Valve: The mitral valve is grossly normal. No evidence of mitral valve regurgitation. Tricuspid Valve: The tricuspid valve is grossly normal. Tricuspid valve regurgitation is trivial. Aortic Valve: The aortic valve is tricuspid. Aortic valve regurgitation is not visualized. Pulmonic Valve: The pulmonic valve was grossly normal. Pulmonic valve regurgitation is not visualized. Pulmonic regurgitation is not visualized. Aorta: The aortic root is normal in size and structure. Venous: The inferior vena cava is normal in size with greater than 50% respiratory variability, suggesting right atrial pressure of 3 mmHg. IAS/Shunts: No atrial level shunt detected by color flow Doppler.  LEFT VENTRICLE PLAX 2D LVIDd:         3.58 cm  Diastology LVIDs:         1.66 cm  LV e' lateral:   7.30 cm/s LV PW:         1.04 cm  LV E/e' lateral: 9.8 LV IVS:        1.18 cm  LV e' medial:    9.09 cm/s LVOT diam:     2.00 cm  LV E/e' medial:  7.9 LV SV:         46 ml LV SV Index:   27.38 LVOT Area:     3.14 cm  RIGHT VENTRICLE RV S prime:     17.90 cm/s TAPSE (M-mode): 2.3 cm LEFT ATRIUM             Index       RIGHT ATRIUM           Index LA diam:        2.90 cm 1.72 cm/m  RA Area:     13.00 cm LA Vol (A2C):   22.2 ml 13.13 ml/m RA Volume:   27.00 ml  15.97 ml/m LA Vol (A4C):   32.9 ml 19.46 ml/m LA Biplane Vol: 29.6 ml 17.51 ml/m   AORTA Ao Root diam: 2.80 cm MITRAL VALVE MV Area (PHT): 2.48 cm              SHUNTS MV PHT:        88.74 msec            Systemic Diam: 2.00 cm MV Decel Time:  306 msec MV E velocity: 71.60 cm/s  103 cm/s MV A velocity: 103.00 cm/s 70.3 cm/s MV E/A ratio:  0.70        1.5  Kate Sable MD Electronically signed by Kate Sable MD Signature Date/Time: 12/21/2019/11:35:10 AM    Final       Subjective: Patient says she feels a lot better.  Discharge Exam: Vitals:   12/24/19 0450 12/24/19 1011  BP: 135/62   Pulse: 87   Resp: 15   Temp: 98.6 F (37 C)   SpO2: 98% 97%   Vitals:   12/23/19 1351 12/23/19 2048 12/24/19 0450 12/24/19 1011  BP: 115/62 (!) 145/66 135/62   Pulse: 90 87 87   Resp: 17 17 15    Temp: 98.2 F (36.8 C) 98.6 F (37 C) 98.6 F (37 C)   TempSrc: Oral Oral Oral   SpO2: 100% 100% 98% 97%  Weight:      Height:       General: Pt is alert, awake, not in acute distress Cardiovascular: irregularly irregular,  S1/S2 +, no rubs, no gallops Respiratory: CTA bilaterally, no wheezing, no rhonchi Abdominal: Soft, NT, ND, bowel sounds + Extremities: no edema, no cyanosis   The results of significant diagnostics from this hospitalization (including imaging, microbiology, ancillary and laboratory) are listed below for reference.     Microbiology: Recent Results (from the past 240 hour(s))  Respiratory Panel by RT PCR (Flu A&B, Covid) - Nasopharyngeal Swab     Status: None   Collection Time: 12/21/19  5:33 AM   Specimen: Nasopharyngeal Swab  Result Value Ref Range Status   SARS Coronavirus 2 by RT PCR NEGATIVE NEGATIVE Final    Comment: (NOTE) SARS-CoV-2 target nucleic acids are NOT DETECTED. The SARS-CoV-2 RNA is generally detectable in upper respiratoy specimens during the acute phase of infection. The lowest concentration of SARS-CoV-2 viral copies this assay can detect is 131 copies/mL. A negative result does not preclude SARS-Cov-2 infection and should not be used as the sole basis for treatment or other patient management decisions. A negative result may occur with  improper specimen collection/handling,  submission of specimen other than nasopharyngeal swab, presence of viral mutation(s) within the areas targeted by this assay, and inadequate number of viral copies (<131 copies/mL). A negative result must be combined with clinical observations, patient history, and epidemiological information. The expected result is Negative. Fact Sheet for Patients:  PinkCheek.be Fact Sheet for Healthcare Providers:  GravelBags.it This test is not yet ap proved or cleared by the [place] FDA and  has been authorized for detection and/or diagnosis of SARS-CoV-2 by FDA under an Emergency Use Authorization (EUA). This EUA will remain  in effect (meaning this test can be used) for the duration of the COVID-19 declaration under Section 564(b)(1) of the Act, 21 U.S.C. section 360bbb-3(b)(1), unless the authorization is terminated or revoked sooner.    Influenza A by PCR NEGATIVE NEGATIVE Final   Influenza B by PCR NEGATIVE NEGATIVE Final    Comment: (NOTE) The Xpert Xpress SARS-CoV-2/FLU/RSV assay is intended as an aid in  the diagnosis of influenza from Nasopharyngeal swab specimens and  should not be used as a sole basis for treatment. Nasal washings and  aspirates are unacceptable for Xpert Xpress SARS-CoV-2/FLU/RSV  testing. Fact Sheet for Patients: PinkCheek.be Fact Sheet for Healthcare Providers: GravelBags.it This test is not yet approved or cleared by the [place] FDA and  has been authorized for detection and/or diagnosis of SARS-CoV-2 by  FDA under an Emergency Use Authorization (EUA). This EUA will remain  in effect (meaning this test can be used) for the duration of the  Covid-19 declaration under Section 564(b)(1) of the Act, 21  U.S.C. section 360bbb-3(b)(1), unless the authorization is  terminated or revoked. Performed at [facility], [address],  [place], [place] [address]      Labs: BNP (last 3 results) No results for input(s): BNP in the last 8760 hours. Basic Metabolic Panel: Recent Labs  Lab 12/21/19 0609 12/21/19 1204 12/21/19 1740 12/21/19 2325 12/22/19 0608 12/23/19 0536 12/24/19 0606  NA 111*   < > 112* 112* 117* 125* 130*  K 2.3*   < > 3.5 4.2 4.2 4.0 3.2*  CL 74*   < > 81* 84* 89* 96* 100  CO2 20*   < > 21* 18* 18* 19* 22  GLUCOSE 245*   < > 139* 127* 117* 116* 90  BUN 7*   < > 8 8 8 9 8   CREATININE 0.53   < > 0.46 0.50 0.53  0.63 0.54  CALCIUM 8.1*   < > 7.4* 7.3* 7.4* 8.2* 8.2*  MG 1.0*  --  2.4  --  2.0 2.0  --   PHOS  --   --   --   --  1.6*  --   --    < > = values in this interval not displayed.   Liver Function Tests: Recent Labs  Lab 12/22/19 0608  ALBUMIN 3.1*   No results for input(s): LIPASE, AMYLASE in the last 168 hours. No results for input(s): AMMONIA in the last 168 hours. CBC: Recent Labs  Lab 12/21/19 0609 12/22/19 0608 12/23/19 0536  WBC 5.0 15.0* 10.9*  HGB 11.2* 11.5* 11.5*  HCT 31.1* 33.1* 33.7*  MCV 92.6 91.7 93.4  PLT 215 229 234   Cardiac Enzymes: No results for input(s): CKTOTAL, CKMB, CKMBINDEX, TROPONINI in the last 168 hours. BNP: Invalid input(s): POCBNP CBG: Recent Labs  Lab 12/23/19 1613 12/23/19 2021 12/24/19 0020 12/24/19 0452 12/24/19 0730  GLUCAP 94 85 91 85 97   D-Dimer No results for input(s): DDIMER in the last 72 hours. Hgb A1c Recent Labs    12/21/19 1740  HGBA1C 5.7*   Lipid Profile No results for input(s): CHOL, HDL, LDLCALC, TRIG, CHOLHDL, LDLDIRECT in the last 72 hours. Thyroid function studies Recent Labs    12/22/19 0608  TSH 1.167   Anemia work up No results for input(s): VITAMINB12, FOLATE, FERRITIN, TIBC, IRON, RETICCTPCT in the last 72 hours. Urinalysis    Component Value Date/Time   COLORURINE YELLOW 10/05/2011 0426   APPEARANCEUR CLEAR 10/05/2011 0426   LABSPEC <1.005 (L) 10/05/2011 0426   PHURINE 6.5 10/05/2011 0426    GLUCOSEU NEGATIVE 10/05/2011 0426   HGBUR TRACE (A) 10/05/2011 0426   BILIRUBINUR NEGATIVE 10/05/2011 0426   KETONESUR NEGATIVE 10/05/2011 0426   PROTEINUR NEGATIVE 10/05/2011 0426   UROBILINOGEN 0.2 10/05/2011 0426   NITRITE NEGATIVE 10/05/2011 0426   LEUKOCYTESUR NEGATIVE 10/05/2011 0426   Sepsis Labs Invalid input(s): PROCALCITONIN,  WBC,  LACTICIDVEN Microbiology Recent Results (from the past 240 hour(s))  Respiratory Panel by RT PCR (Flu A&B, Covid) - Nasopharyngeal Swab     Status: None   Collection Time: 12/21/19  5:33 AM   Specimen: Nasopharyngeal Swab  Result Value Ref Range Status   SARS Coronavirus 2 by RT PCR NEGATIVE NEGATIVE Final    Comment: (NOTE) SARS-CoV-2 target nucleic acids are NOT DETECTED. The SARS-CoV-2 RNA is generally detectable in upper respiratoy specimens during the acute phase of infection. The lowest concentration of SARS-CoV-2 viral copies this assay can detect is 131 copies/mL. A negative result does not preclude SARS-Cov-2 infection and should not be used as the sole basis for treatment or other patient management decisions. A negative result may occur with  improper specimen collection/handling, submission of specimen other than nasopharyngeal swab, presence of viral mutation(s) within the areas targeted by this assay, and inadequate number of viral copies (<131 copies/mL). A negative result must be combined with clinical observations, patient history, and epidemiological information. The expected result is Negative. Fact Sheet for Patients:  PinkCheek.be Fact Sheet for Healthcare Providers:  GravelBags.it This test is not yet ap proved or cleared by the [place] FDA and  has been authorized for detection and/or diagnosis of SARS-CoV-2 by FDA under an Emergency Use Authorization (EUA). This EUA will remain  in effect (meaning this test can be used) for the duration of  the COVID-19 declaration under Section 564(b)(1) of the Act, 21 U.S.C. section  360bbb-3(b)(1), unless the authorization is terminated or revoked sooner.    Influenza A by PCR NEGATIVE NEGATIVE Final   Influenza B by PCR NEGATIVE NEGATIVE Final    Comment: (NOTE) The Xpert Xpress SARS-CoV-2/FLU/RSV assay is intended as an aid in  the diagnosis of influenza from Nasopharyngeal swab specimens and  should not be used as a sole basis for treatment. Nasal washings and  aspirates are unacceptable for Xpert Xpress SARS-CoV-2/FLU/RSV  testing. Fact Sheet for Patients: PinkCheek.be Fact Sheet for Healthcare Providers: GravelBags.it This test is not yet approved or cleared by the [place] FDA and  has been authorized for detection and/or diagnosis of SARS-CoV-2 by  FDA under an Emergency Use Authorization (EUA). This EUA will remain  in effect (meaning this test can be used) for the duration of the  Covid-19 declaration under Section 564(b)(1) of the Act, 21  U.S.C. section 360bbb-3(b)(1), unless the authorization is  terminated or revoked. Performed at [facility], [address], [place], [place] [address]     Time coordinating discharge: 35 minutes    SIGNED:  Irwin Brakeman, MD  [facility] 12/24/2019, 11:02 AM How to contact the [facility] Attending or Consulting provider [place] or covering provider during after hours [place], for this patient?  1. Check the care team in [facility] and look for a) attending/consulting [facility] provider listed and b) the [facility] team listed 2. Log into [email or web address] and use [place] universal password to access. If you do not have the password, please contact the hospital operator. 3. Locate the [facility] provider you are looking for under [facility] and page to a number that you can be directly reached. 4. If you still have difficulty reaching the provider, please page the [facility] (Director on Call)  for the Hospitalists listed on amion for assistance.

## 2019-12-24 NOTE — Progress Notes (Signed)
Bladder scanned patient and obtain reading of greater than 999. Son at bedside and wanted patient to sat on [facility]. Void 184mL. Patient was in and out cath. 1262mL return. Patient tolerated well.

## 2019-12-24 NOTE — Discharge Instructions (Signed)
Full Liquid Diet Only Until You Can See Dr. Oneida Alar Please make appointment with [facility] about foley removal   Esophageal Stricture  Esophageal stricture is a narrowing (stricture) of the esophagus. The esophagus is the part of the body that moves food and liquid from your mouth to your stomach. The esophagus can become narrow because of disease or damage to the area. This condition can make swallowing difficult, painful, or even impossible. It also makes choking more likely. What are the causes? The most common cause of this condition is gastroesophageal reflux disease (GERD). Normally, food travels down the esophagus and stays in the stomach to be digested. In GERD, food and stomach acid move back up into the esophagus. Over time, this causes scar tissue and leads to narrowing. Other causes of esophageal stricture include:  Scarring from swallowing (ingesting) a harmful substance.  Damage from medical instruments used in the esophagus.  Radiation therapy.  Cancer.  Inflammation of the esophagus. What increases the risk? You are more likely to develop an esophageal stricture if you have GERD or esophageal cancer. What are the signs or symptoms? Symptoms of this condition include:  Difficulty swallowing.  Pain when swallowing.  Burning pain or discomfort in the throat or chest (heartburn).  Vomiting or spitting up (regurgitating) food or liquids.  Unexplained weight loss. How is this diagnosed? This condition may be diagnosed based on:  Your symptoms and a physical exam.  Tests, such as: ? Upper endoscopy. Your health care provider will insert a flexible tube with a tiny camera on it (endoscope) into your esophagus to check for a stricture. A tissue sample (biopsy) may also be taken to be examined under a microscope. ? Esophageal pH monitoring. This test involves using a tube to collect acid in the esophagus to determine how much stomach acid is entering the  esophagus. ? Barium swallow test. For this test, you will drink a chalky liquid (barium solution) that coats the lining of the esophagus. Then you will have an X-ray taken. The barium solution helps to show if there is a stricture. How is this treated? Treatment for esophageal stricture depends on what is causing your condition and how severe your condition is. Treatment options include:  Esophageal dilation. In this procedure, a health care provider inserts an endoscope or a tool called a dilator into the esophagus to gently stretch it and make the opening wider.  Stents. In some cases, a health care provider may place a small device (stent) in the esophagus to keep it open.  Acid-blocking medicines. Taking these can help you manage GERD symptoms after an esophageal stricture. Controlling your GERD symptoms or being free of them can prevent the stricture from returning. Follow these instructions at home: Eating and drinking      Follow instructions from your health care provider about any diet changes.  Cut your food into small pieces, chew well, and eat slowly  Try to eat soft food that is easier to swallow.  Eat and drink only when you are sitting upright.  Do not drink alcohol. If you need help quitting, ask your health care provider.  Do not eat during the 3 hours before bedtime.  Do not overeat at meals.  Do not eat foods that can make reflux worse. These include: ? Fatty foods, such as red meat and processed foods. ? Spicy foods. ? Soda. ? Tomato products. ? Chocolate. General instructions  Take over-the-counter and prescription medicines only as told by your health care  provider.  Do not use any products that contain nicotine or tobacco, such as cigarettes and e-cigarettes. If you need help quitting, ask your health care provider.  Lose weight if you are overweight.  Wear loose, comfortable clothing.  When lying in bed, raise (elevate) your head with pillows.  This will help to prevent your stomach contents from backing up into your esophagus while you sleep.  Keep all follow-up visits as told by your health care provider. This is important. Contact a health care provider if:  You have problems eating or swallowing.  You regurgitate food and liquid.  Your symptoms do not improve with treatment. Get help right away if:  You can no longer keep down any food, drinks, or your saliva. Summary  Esophageal stricture is a narrowing of the part of the body that moves food and liquid from your mouth to your stomach (esophagus).  The esophagus can become narrow because of disease or damage to the area. This can make swallowing difficult, painful, or even impossible.  Treatment for esophageal stricture depends on what is causing your condition and how severe your condition is. In some cases, procedures may be done to make the opening of the esophagus wider or to place a stent in the esophagus to keep it open.  Do not drink alcohol, overeat at meals, or eat foods that can make reflux worse. This information is not intended to replace advice given to you by your health care provider. Make sure you discuss any questions you have with your health care provider. Document Revised: 01/28/2018 Document Reviewed: 07/09/2017 Elsevier Patient Education  [employer].    IMPORTANT INFORMATION: PAY CLOSE ATTENTION   PHYSICIAN DISCHARGE INSTRUCTIONS  Follow with Primary care provider  Iona Beard, MD  and other consultants as instructed by your Hospitalist Physician  [place] IF SYMPTOMS COME BACK, WORSEN OR NEW PROBLEM DEVELOPS   Please note: You were cared for by a hospitalist during your hospital stay. Every effort will be made to forward records to your primary care provider.  You can request that your primary care provider send for your hospital records if they have not received them.  Once you are discharged,  your primary care physician will handle any further medical issues. Please note that NO REFILLS for any discharge medications will be authorized once you are discharged, as it is imperative that you return to your primary care physician (or establish a relationship with a primary care physician if you do not have one) for your post hospital discharge needs so that they can reassess your need for medications and monitor your lab values.  Please get a complete blood count and chemistry panel checked by your Primary MD at your next visit, and again as instructed by your Primary MD.  Get Medicines reviewed and adjusted: Please take all your medications with you for your next visit with your Primary MD  Laboratory/radiological data: Please request your Primary MD to go over all hospital tests and procedure/radiological results at the follow up, please ask your primary care provider to get all Hospital records sent to his/her office.  In some cases, they will be blood work, cultures and biopsy results pending at the time of your discharge. Please request that your primary care provider follow up on these results.  If you are diabetic, please bring your blood sugar readings with you to your follow up appointment with primary care.    Please call and  make your follow up appointments as soon as possible.    Also Note the following: If you experience worsening of your admission symptoms, develop shortness of breath, life threatening emergency, suicidal or homicidal thoughts you must seek medical attention immediately by calling 911 or calling your MD immediately  if symptoms less severe.  You must read complete instructions/literature along with all the possible adverse reactions/side effects for all the Medicines you take and that have been prescribed to you. Take any new Medicines after you have completely understood and accpet all the possible adverse reactions/side effects.   Do not drive when taking  Pain medications or sleeping medications (Benzodiazepines)  Do not take more than prescribed Pain, Sleep and Anxiety Medications. It is not advisable to combine anxiety,sleep and pain medications without talking with your primary care practitioner  Special Instructions: If you have smoked or chewed Tobacco  in the last 2 yrs please stop smoking, stop any regular Alcohol  and or any Recreational drug use.  Wear Seat belts while driving.  Do not drive if taking any narcotic, mind altering or controlled substances or recreational drugs or alcohol.

## 2019-12-24 NOTE — Progress Notes (Addendum)
Bladder scanned patient and obtain reading 1073mL. In and Out catheterization done. 900 mL return. Pt tolerated well. Thick milky colored urine observed in catheter tubing. Tan discharged noted from vagina. MD made aware via Amion.

## 2019-12-28 DIAGNOSIS — E871 Hypo-osmolality and hyponatremia: Secondary | ICD-10-CM | POA: Diagnosis not present

## 2019-12-28 DIAGNOSIS — M1710 Unilateral primary osteoarthritis, unspecified knee: Secondary | ICD-10-CM | POA: Diagnosis not present

## 2019-12-28 DIAGNOSIS — I4891 Unspecified atrial fibrillation: Secondary | ICD-10-CM | POA: Diagnosis not present

## 2019-12-28 DIAGNOSIS — K222 Esophageal obstruction: Secondary | ICD-10-CM | POA: Diagnosis not present

## 2019-12-28 DIAGNOSIS — D649 Anemia, unspecified: Secondary | ICD-10-CM | POA: Diagnosis not present

## 2019-12-28 DIAGNOSIS — I10 Essential (primary) hypertension: Secondary | ICD-10-CM | POA: Diagnosis not present

## 2019-12-28 DIAGNOSIS — R1310 Dysphagia, unspecified: Secondary | ICD-10-CM | POA: Diagnosis not present

## 2019-12-28 DIAGNOSIS — F0390 Unspecified dementia without behavioral disturbance: Secondary | ICD-10-CM | POA: Diagnosis not present

## 2019-12-28 DIAGNOSIS — F05 Delirium due to known physiological condition: Secondary | ICD-10-CM | POA: Diagnosis not present

## 2020-01-04 ENCOUNTER — Ambulatory Visit: Payer: Medicare HMO | Admitting: Student

## 2020-01-09 DIAGNOSIS — R1310 Dysphagia, unspecified: Secondary | ICD-10-CM | POA: Diagnosis not present

## 2020-01-11 DIAGNOSIS — F0390 Unspecified dementia without behavioral disturbance: Secondary | ICD-10-CM | POA: Diagnosis not present

## 2020-01-11 DIAGNOSIS — F05 Delirium due to known physiological condition: Secondary | ICD-10-CM | POA: Diagnosis not present

## 2020-01-11 DIAGNOSIS — E871 Hypo-osmolality and hyponatremia: Secondary | ICD-10-CM | POA: Diagnosis not present

## 2020-01-11 DIAGNOSIS — I4891 Unspecified atrial fibrillation: Secondary | ICD-10-CM | POA: Diagnosis not present

## 2020-01-11 DIAGNOSIS — D649 Anemia, unspecified: Secondary | ICD-10-CM | POA: Diagnosis not present

## 2020-01-11 DIAGNOSIS — R1310 Dysphagia, unspecified: Secondary | ICD-10-CM | POA: Diagnosis not present

## 2020-01-11 DIAGNOSIS — M1710 Unilateral primary osteoarthritis, unspecified knee: Secondary | ICD-10-CM | POA: Diagnosis not present

## 2020-01-11 DIAGNOSIS — I10 Essential (primary) hypertension: Secondary | ICD-10-CM | POA: Diagnosis not present

## 2020-01-11 DIAGNOSIS — K222 Esophageal obstruction: Secondary | ICD-10-CM | POA: Diagnosis not present

## 2020-01-12 DIAGNOSIS — R338 Other retention of urine: Secondary | ICD-10-CM | POA: Diagnosis not present

## 2020-01-16 ENCOUNTER — Ambulatory Visit: Payer: Medicare HMO | Admitting: Urology

## 2020-01-18 DIAGNOSIS — E871 Hypo-osmolality and hyponatremia: Secondary | ICD-10-CM | POA: Diagnosis not present

## 2020-01-18 DIAGNOSIS — F0390 Unspecified dementia without behavioral disturbance: Secondary | ICD-10-CM | POA: Diagnosis not present

## 2020-01-18 DIAGNOSIS — I4891 Unspecified atrial fibrillation: Secondary | ICD-10-CM | POA: Diagnosis not present

## 2020-01-18 DIAGNOSIS — K222 Esophageal obstruction: Secondary | ICD-10-CM | POA: Diagnosis not present

## 2020-01-18 DIAGNOSIS — F05 Delirium due to known physiological condition: Secondary | ICD-10-CM | POA: Diagnosis not present

## 2020-01-18 DIAGNOSIS — R1310 Dysphagia, unspecified: Secondary | ICD-10-CM | POA: Diagnosis not present

## 2020-01-18 DIAGNOSIS — D649 Anemia, unspecified: Secondary | ICD-10-CM | POA: Diagnosis not present

## 2020-01-18 DIAGNOSIS — M1710 Unilateral primary osteoarthritis, unspecified knee: Secondary | ICD-10-CM | POA: Diagnosis not present

## 2020-01-18 DIAGNOSIS — I10 Essential (primary) hypertension: Secondary | ICD-10-CM | POA: Diagnosis not present

## 2020-01-22 ENCOUNTER — Other Ambulatory Visit: Payer: Self-pay

## 2020-01-22 ENCOUNTER — Ambulatory Visit (INDEPENDENT_AMBULATORY_CARE_PROVIDER_SITE_OTHER): Payer: Medicare HMO | Admitting: Orthopedic Surgery

## 2020-01-22 ENCOUNTER — Encounter: Payer: Self-pay | Admitting: Orthopedic Surgery

## 2020-01-22 DIAGNOSIS — Z7689 Persons encountering health services in other specified circumstances: Secondary | ICD-10-CM | POA: Diagnosis not present

## 2020-01-22 DIAGNOSIS — M25562 Pain in left knee: Secondary | ICD-10-CM | POA: Diagnosis not present

## 2020-01-22 DIAGNOSIS — M25561 Pain in right knee: Secondary | ICD-10-CM | POA: Diagnosis not present

## 2020-01-22 MED ORDER — HYDROCODONE-ACETAMINOPHEN 5-325 MG PO TABS: ORAL_TABLET | ORAL | 0 refills | Status: DC

## 2020-01-22 NOTE — Patient Instructions (Signed)

## 2020-01-22 NOTE — Progress Notes (Signed)
Chief Complaint  Patient presents with  . Knee Pain    Bilateral Knee Pain/wants injections  . Medication Refill    Hydrocodone    Both knees look amenable to injection  Procedure note for bilateral knee injections  Procedure note left knee injection verbal consent was obtained to inject left knee joint  Timeout was completed to confirm the site of injection  The medications used were 40 mg of Depo-Medrol and 1% lidocaine 3 cc  Anesthesia was provided by ethyl chloride and the skin was prepped with alcohol.  After cleaning the skin with alcohol a 20-gauge needle was used to inject the left knee joint. There were no complications. A sterile bandage was applied.   Procedure note right knee injection verbal consent was obtained to inject right knee joint  Timeout was completed to confirm the site of injection  The medications used were 40 mg of Depo-Medrol and 1% lidocaine 3 cc  Anesthesia was provided by ethyl chloride and the skin was prepped with alcohol.  After cleaning the skin with alcohol a 20-gauge needle was used to inject the right knee joint. There were no complications. A sterile bandage was applied.  Encounter Diagnosis  Name Primary?  Marland Kitchen Arthralgia of both lower legs     Meds ordered this encounter  Medications  . HYDROcodone-acetaminophen (NORCO/VICODIN) 5-325 MG tablet    Sig: TAKE 1 TABLET BY MOUTH EVERY 8 HOURS AS NEEDED FOR MODERATE PAIN.    Dispense:  90 tablet    Refill:  0

## 2020-01-26 DIAGNOSIS — I4891 Unspecified atrial fibrillation: Secondary | ICD-10-CM | POA: Diagnosis not present

## 2020-01-26 DIAGNOSIS — M1710 Unilateral primary osteoarthritis, unspecified knee: Secondary | ICD-10-CM | POA: Diagnosis not present

## 2020-01-26 DIAGNOSIS — I10 Essential (primary) hypertension: Secondary | ICD-10-CM | POA: Diagnosis not present

## 2020-01-26 DIAGNOSIS — M6281 Muscle weakness (generalized): Secondary | ICD-10-CM | POA: Diagnosis not present

## 2020-01-26 DIAGNOSIS — F05 Delirium due to known physiological condition: Secondary | ICD-10-CM | POA: Diagnosis not present

## 2020-01-26 DIAGNOSIS — K222 Esophageal obstruction: Secondary | ICD-10-CM | POA: Diagnosis not present

## 2020-01-26 DIAGNOSIS — R1310 Dysphagia, unspecified: Secondary | ICD-10-CM | POA: Diagnosis not present

## 2020-01-26 DIAGNOSIS — E871 Hypo-osmolality and hyponatremia: Secondary | ICD-10-CM | POA: Diagnosis not present

## 2020-01-26 DIAGNOSIS — D649 Anemia, unspecified: Secondary | ICD-10-CM | POA: Diagnosis not present

## 2020-01-26 DIAGNOSIS — F0390 Unspecified dementia without behavioral disturbance: Secondary | ICD-10-CM | POA: Diagnosis not present

## 2020-01-26 DIAGNOSIS — R262 Difficulty in walking, not elsewhere classified: Secondary | ICD-10-CM | POA: Diagnosis not present

## 2020-02-08 DIAGNOSIS — Z7689 Persons encountering health services in other specified circumstances: Secondary | ICD-10-CM | POA: Diagnosis not present

## 2020-02-08 DIAGNOSIS — R338 Other retention of urine: Secondary | ICD-10-CM | POA: Diagnosis not present

## 2020-02-20 DIAGNOSIS — I10 Essential (primary) hypertension: Secondary | ICD-10-CM | POA: Diagnosis not present

## 2020-02-20 DIAGNOSIS — E785 Hyperlipidemia, unspecified: Secondary | ICD-10-CM | POA: Diagnosis not present

## 2020-02-20 DIAGNOSIS — Z7689 Persons encountering health services in other specified circumstances: Secondary | ICD-10-CM | POA: Diagnosis not present

## 2020-03-07 ENCOUNTER — Telehealth: Payer: Self-pay | Admitting: Orthopedic Surgery

## 2020-03-07 NOTE — Telephone Encounter (Signed)
Patient's daughter and designated contact on file, Tyna Jaksch, called to inquire about a copy of patient's (secondary) insurance card; will pick up; aware most recent card should be forthcoming in mail from insurer.

## 2020-03-16 DIAGNOSIS — M6281 Muscle weakness (generalized): Secondary | ICD-10-CM | POA: Diagnosis not present

## 2020-03-16 DIAGNOSIS — R262 Difficulty in walking, not elsewhere classified: Secondary | ICD-10-CM | POA: Diagnosis not present

## 2020-03-25 ENCOUNTER — Encounter: Payer: Self-pay | Admitting: Orthopedic Surgery

## 2020-03-25 ENCOUNTER — Other Ambulatory Visit: Payer: Self-pay

## 2020-03-25 ENCOUNTER — Ambulatory Visit (INDEPENDENT_AMBULATORY_CARE_PROVIDER_SITE_OTHER): Payer: Medicare HMO | Admitting: Orthopedic Surgery

## 2020-03-25 VITALS — BP 149/94 | HR 100 | Ht 67.0 in

## 2020-03-25 DIAGNOSIS — Z7689 Persons encountering health services in other specified circumstances: Secondary | ICD-10-CM | POA: Diagnosis not present

## 2020-03-25 DIAGNOSIS — M25561 Pain in right knee: Secondary | ICD-10-CM | POA: Diagnosis not present

## 2020-03-25 DIAGNOSIS — M25562 Pain in left knee: Secondary | ICD-10-CM | POA: Diagnosis not present

## 2020-03-25 MED ORDER — HYDROCODONE-ACETAMINOPHEN 5-325 MG PO TABS: ORAL_TABLET | ORAL | 0 refills | Status: DC

## 2020-03-25 MED ORDER — XANAX 0.25 MG PO TABS: 0.2500 mg | ORAL_TABLET | Freq: Every evening | ORAL | 3 refills | Status: DC | PRN

## 2020-03-25 NOTE — Progress Notes (Signed)
Chief Complaint  Patient presents with  . Injections    both knees    BP (!) 149/94   Pulse 100   Ht 5\' 7"  (1.702 m)   BMI 20.54 kg/m    Procedure note for bilateral knee injections   Procedure note left knee injection verbal consent was obtained to inject left knee joint   Timeout was completed to confirm the site of injection   The medications used were 40 mg of Depo-Medrol and 1% lidocaine 3 cc   Anesthesia was provided by ethyl chloride and the skin was prepped with alcohol.   After cleaning the skin with alcohol a 20-gauge needle was used to inject the left knee joint. There were no complications. A sterile bandage was applied.     Procedure note right knee injection verbal consent was obtained to inject right knee joint   Timeout was completed to confirm the site of injection   The medications used were 40 mg of Depo-Medrol and 1% lidocaine 3 cc   Anesthesia was provided by ethyl chloride and the skin was prepped with alcohol.   After cleaning the skin with alcohol a 20-gauge needle was used to inject the right knee joint. There were no complications. A sterile bandage was applied.       Encounter Diagnosis  Name Primary?  Marland Kitchen Arthralgia of both lower legs     Meds ordered this encounter  Medications  . HYDROcodone-acetaminophen (NORCO/VICODIN) 5-325 MG tablet    Sig: TAKE 1 TABLET BY MOUTH EVERY 8 HOURS AS NEEDED FOR MODERATE PAIN.    Dispense:  90 tablet    Refill:  0  . XANAX 0.25 MG tablet    Sig: Take 1 tablet (0.25 mg total) by mouth at bedtime as needed.    Dispense:  30 tablet    Refill:  3

## 2020-04-27 DIAGNOSIS — R262 Difficulty in walking, not elsewhere classified: Secondary | ICD-10-CM | POA: Diagnosis not present

## 2020-04-27 DIAGNOSIS — M6281 Muscle weakness (generalized): Secondary | ICD-10-CM | POA: Diagnosis not present

## 2020-05-27 ENCOUNTER — Ambulatory Visit (INDEPENDENT_AMBULATORY_CARE_PROVIDER_SITE_OTHER): Payer: Medicare HMO | Admitting: Orthopedic Surgery

## 2020-05-27 ENCOUNTER — Encounter: Payer: Self-pay | Admitting: Orthopedic Surgery

## 2020-05-27 ENCOUNTER — Other Ambulatory Visit: Payer: Self-pay

## 2020-05-27 VITALS — BP 139/115 | HR 92 | Ht 67.0 in | Wt 130.0 lb

## 2020-05-27 DIAGNOSIS — R262 Difficulty in walking, not elsewhere classified: Secondary | ICD-10-CM | POA: Diagnosis not present

## 2020-05-27 DIAGNOSIS — F419 Anxiety disorder, unspecified: Secondary | ICD-10-CM

## 2020-05-27 DIAGNOSIS — M25561 Pain in right knee: Secondary | ICD-10-CM

## 2020-05-27 DIAGNOSIS — Z7689 Persons encountering health services in other specified circumstances: Secondary | ICD-10-CM | POA: Diagnosis not present

## 2020-05-27 DIAGNOSIS — M25562 Pain in left knee: Secondary | ICD-10-CM

## 2020-05-27 DIAGNOSIS — M6281 Muscle weakness (generalized): Secondary | ICD-10-CM | POA: Diagnosis not present

## 2020-05-27 MED ORDER — XANAX 0.25 MG PO TABS: 0.2500 mg | ORAL_TABLET | Freq: Every evening | ORAL | 3 refills | Status: DC | PRN

## 2020-05-27 MED ORDER — HYDROCODONE-ACETAMINOPHEN 5-325 MG PO TABS: ORAL_TABLET | ORAL | 0 refills | Status: DC

## 2020-05-27 NOTE — Progress Notes (Signed)
Chief Complaint  Patient presents with  . Knee Pain    Bilateral/here for injections/doing ok   Meds ordered this encounter  Medications  . HYDROcodone-acetaminophen (NORCO/VICODIN) 5-325 MG tablet    Sig: TAKE 1 TABLET BY MOUTH EVERY 8 HOURS AS NEEDED FOR MODERATE PAIN.    Dispense:  90 tablet    Refill:  0  . XANAX 0.25 MG tablet    Sig: Take 1 tablet (0.25 mg total) by mouth at bedtime as needed.    Dispense:  30 tablet    Refill:  3    Procedure note for bilateral knee injections  Procedure note left knee injection verbal consent was obtained to inject left knee joint  Timeout was completed to confirm the site of injection  The medications used were 40 mg of Depo-Medrol and 1% lidocaine 3 cc  Anesthesia was provided by ethyl chloride and the skin was prepped with alcohol.  After cleaning the skin with alcohol a 20-gauge needle was used to inject the left knee joint. There were no complications. A sterile bandage was applied.   Procedure note right knee injection verbal consent was obtained to inject right knee joint  Timeout was completed to confirm the site of injection  The medications used were 40 mg of Depo-Medrol and 1% lidocaine 3 cc  Anesthesia was provided by ethyl chloride and the skin was prepped with alcohol.  After cleaning the skin with alcohol a 20-gauge needle was used to inject the right knee joint. There were no complications. A sterile bandage was applied.  Encounter Diagnoses  Name Primary?  Marland Kitchen Arthralgia of both lower legs   . Anxiety Yes

## 2020-05-28 DIAGNOSIS — M130 Polyarthritis, unspecified: Secondary | ICD-10-CM | POA: Diagnosis not present

## 2020-05-28 DIAGNOSIS — I10 Essential (primary) hypertension: Secondary | ICD-10-CM | POA: Diagnosis not present

## 2020-05-28 DIAGNOSIS — Z7689 Persons encountering health services in other specified circumstances: Secondary | ICD-10-CM | POA: Diagnosis not present

## 2020-06-11 ENCOUNTER — Ambulatory Visit: Payer: Medicaid Other | Admitting: Podiatry

## 2020-06-27 DIAGNOSIS — R262 Difficulty in walking, not elsewhere classified: Secondary | ICD-10-CM | POA: Diagnosis not present

## 2020-06-27 DIAGNOSIS — M6281 Muscle weakness (generalized): Secondary | ICD-10-CM | POA: Diagnosis not present

## 2020-07-22 IMAGING — DX DG HAND COMPLETE 3+V*L*
3 series · 3 of 3 positions shown · non-contrast
Comparison: None.

CLINICAL DATA: Left hand closed in car door, left ring finger has
most pain and laceration, all fingers were closed in the door

EXAM:
LEFT HAND - COMPLETE 3+ VIEW

[hand pa]
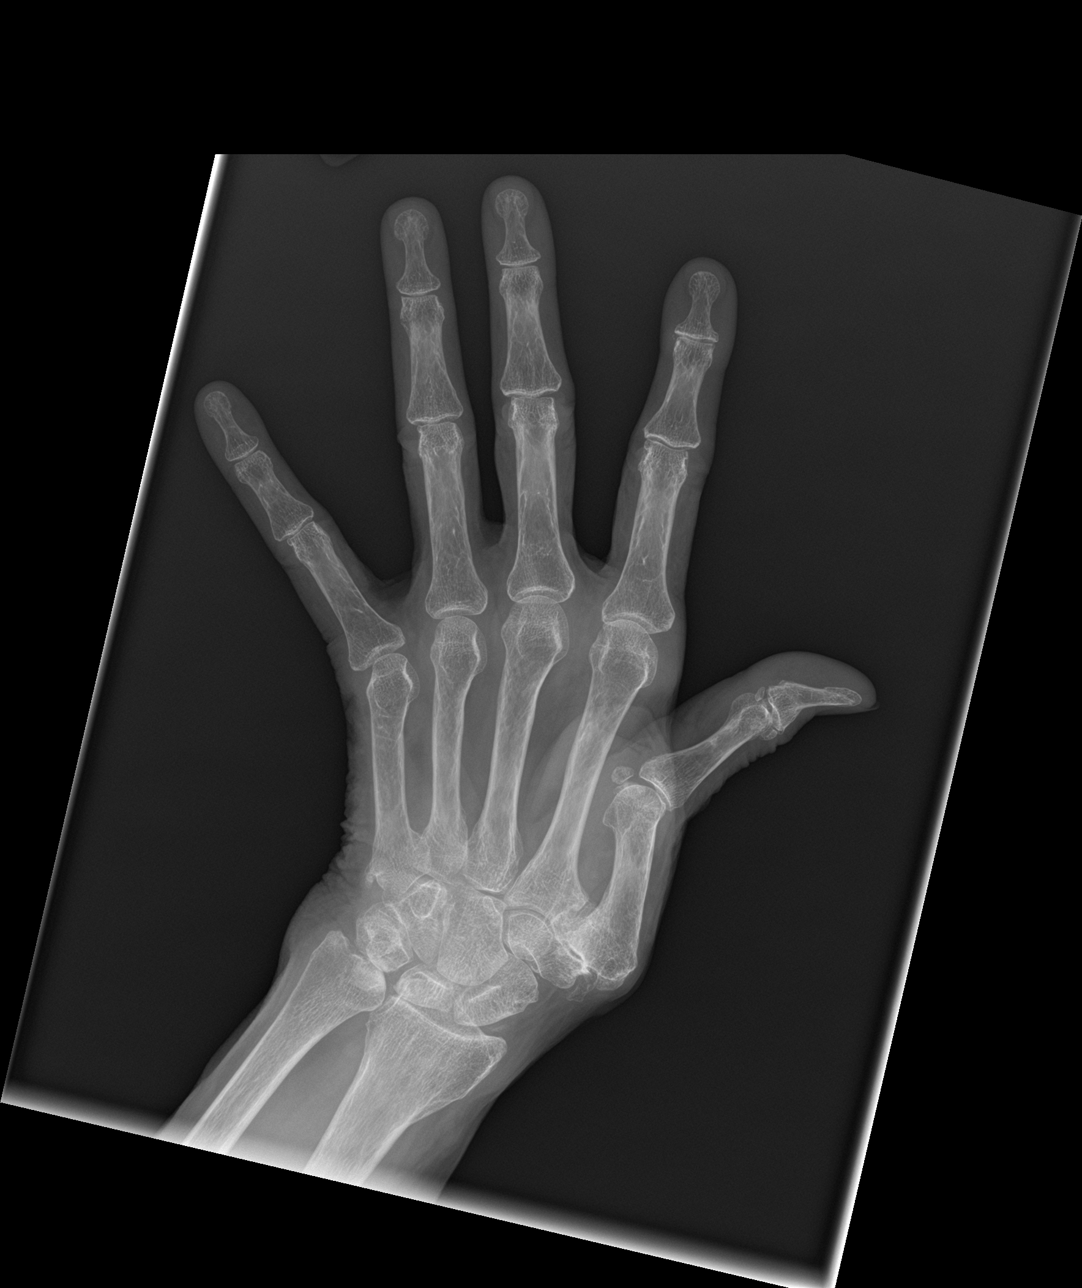

[hand obl]
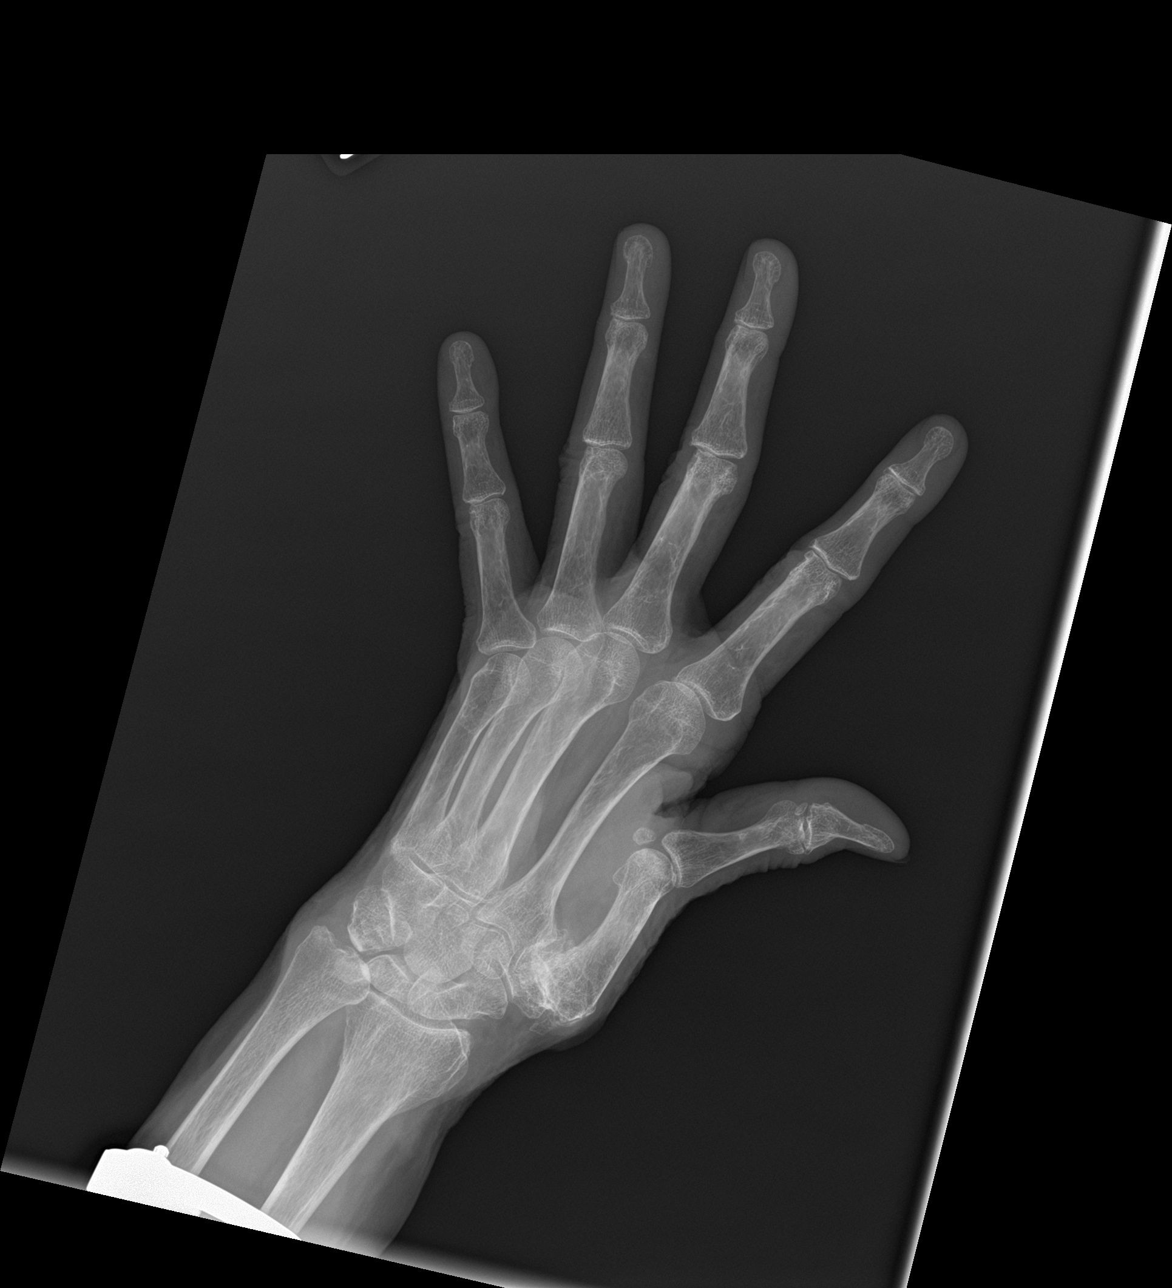

[hand lat]
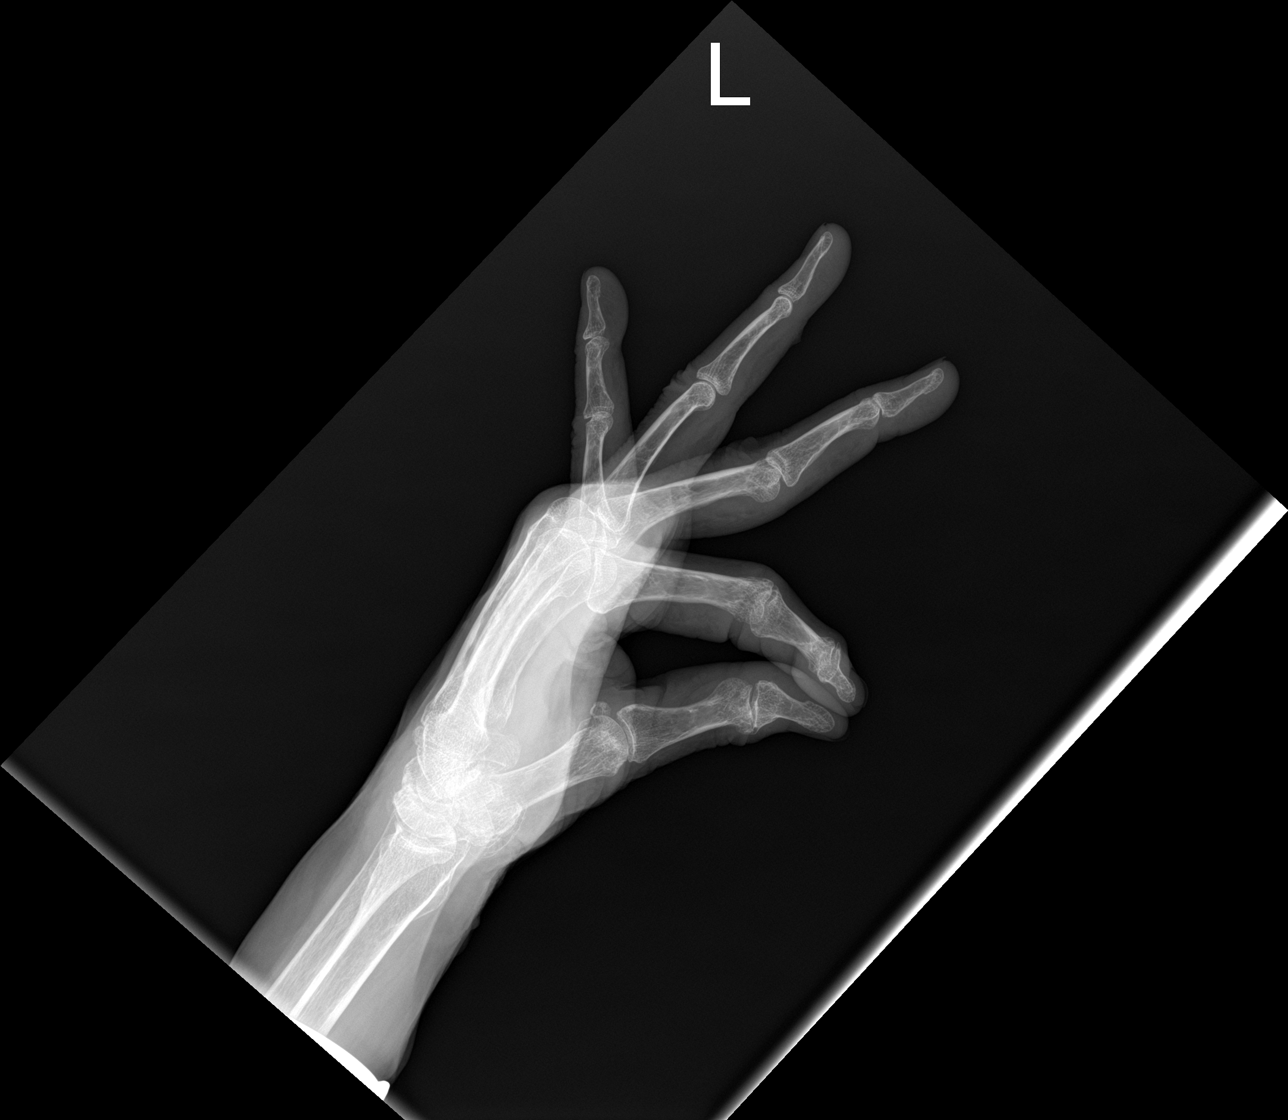

[3 of 3 positions shown; findings below may reference images not displayed]

FINDINGS: No fracture.  No bone lesion.

Joints are normally aligned. There is joint space narrowing with
mild subchondral sclerosis and marginal osteophytes at the trapezium
first metacarpal articulation consistent with osteoarthritis.

Bones are diffusely demineralized.

Small soft tissue laceration along the palmar aspect of the mid to
distal ring finger. No radiopaque foreign body.
IMPRESSION: No fracture, dislocation or radiopaque foreign body.

## 2020-07-28 DIAGNOSIS — R262 Difficulty in walking, not elsewhere classified: Secondary | ICD-10-CM | POA: Diagnosis not present

## 2020-07-28 DIAGNOSIS — M6281 Muscle weakness (generalized): Secondary | ICD-10-CM | POA: Diagnosis not present

## 2020-07-29 ENCOUNTER — Ambulatory Visit (INDEPENDENT_AMBULATORY_CARE_PROVIDER_SITE_OTHER): Payer: Medicare HMO | Admitting: Orthopedic Surgery

## 2020-07-29 ENCOUNTER — Other Ambulatory Visit: Payer: Self-pay

## 2020-07-29 ENCOUNTER — Encounter: Payer: Self-pay | Admitting: Orthopedic Surgery

## 2020-07-29 DIAGNOSIS — F419 Anxiety disorder, unspecified: Secondary | ICD-10-CM

## 2020-07-29 DIAGNOSIS — M25562 Pain in left knee: Secondary | ICD-10-CM | POA: Diagnosis not present

## 2020-07-29 DIAGNOSIS — Z7689 Persons encountering health services in other specified circumstances: Secondary | ICD-10-CM | POA: Diagnosis not present

## 2020-07-29 DIAGNOSIS — M25561 Pain in right knee: Secondary | ICD-10-CM

## 2020-07-29 MED ORDER — XANAX 0.25 MG PO TABS: 0.2500 mg | ORAL_TABLET | Freq: Every evening | ORAL | 3 refills | Status: DC | PRN

## 2020-07-29 MED ORDER — HYDROCODONE-ACETAMINOPHEN 5-325 MG PO TABS: ORAL_TABLET | ORAL | 0 refills | Status: DC

## 2020-07-29 NOTE — Progress Notes (Signed)
Chief Complaint  Patient presents with  . Knee Pain    OA of both knees, has some pain comes andgoes   Encounter Diagnoses  Name Primary?  Marland Kitchen Arthralgia of both lower legs   . Anxiety    Meds ordered this encounter  Medications  . HYDROcodone-acetaminophen (NORCO/VICODIN) 5-325 MG tablet    Sig: TAKE 1 TABLET BY MOUTH EVERY 8 HOURS AS NEEDED FOR MODERATE PAIN.    Dispense:  90 tablet    Refill:  0  . XANAX 0.25 MG tablet    Sig: Take 1 tablet (0.25 mg total) by mouth at bedtime as needed.    Dispense:  30 tablet    Refill:  3    Procedure note for bilateral knee injections  Procedure note left knee injection verbal consent was obtained to inject left knee joint  Timeout was completed to confirm the site of injection  The medications used were 40 mg of Depo-Medrol and 1% lidocaine 3 cc  Anesthesia was provided by ethyl chloride and the skin was prepped with alcohol.  After cleaning the skin with alcohol a 20-gauge needle was used to inject the left knee joint. There were no complications. A sterile bandage was applied.   Procedure note right knee injection verbal consent was obtained to inject right knee joint  Timeout was completed to confirm the site of injection  The medications used were 40 mg of Depo-Medrol and 1% lidocaine 3 cc  Anesthesia was provided by ethyl chloride and the skin was prepped with alcohol.  After cleaning the skin with alcohol a 20-gauge needle was used to inject the right knee joint. There were no complications. A sterile bandage was applied.

## 2020-07-29 NOTE — Patient Instructions (Signed)

## 2020-08-27 DIAGNOSIS — R262 Difficulty in walking, not elsewhere classified: Secondary | ICD-10-CM | POA: Diagnosis not present

## 2020-08-27 DIAGNOSIS — M6281 Muscle weakness (generalized): Secondary | ICD-10-CM | POA: Diagnosis not present

## 2020-09-02 DIAGNOSIS — I10 Essential (primary) hypertension: Secondary | ICD-10-CM | POA: Diagnosis not present

## 2020-09-02 DIAGNOSIS — M159 Polyosteoarthritis, unspecified: Secondary | ICD-10-CM | POA: Diagnosis not present

## 2020-09-02 DIAGNOSIS — Z7689 Persons encountering health services in other specified circumstances: Secondary | ICD-10-CM | POA: Diagnosis not present

## 2020-09-02 DIAGNOSIS — Z23 Encounter for immunization: Secondary | ICD-10-CM | POA: Diagnosis not present

## 2020-09-03 DIAGNOSIS — Z23 Encounter for immunization: Secondary | ICD-10-CM | POA: Diagnosis not present

## 2020-09-20 DIAGNOSIS — Z23 Encounter for immunization: Secondary | ICD-10-CM | POA: Diagnosis not present

## 2020-09-24 ENCOUNTER — Ambulatory Visit: Payer: Medicaid Other | Admitting: Podiatry

## 2020-09-27 DIAGNOSIS — M6281 Muscle weakness (generalized): Secondary | ICD-10-CM | POA: Diagnosis not present

## 2020-09-27 DIAGNOSIS — R262 Difficulty in walking, not elsewhere classified: Secondary | ICD-10-CM | POA: Diagnosis not present

## 2020-09-30 ENCOUNTER — Ambulatory Visit: Payer: Medicare HMO | Admitting: Orthopedic Surgery

## 2020-10-16 ENCOUNTER — Ambulatory Visit (INDEPENDENT_AMBULATORY_CARE_PROVIDER_SITE_OTHER): Payer: Medicare HMO | Admitting: Orthopedic Surgery

## 2020-10-16 ENCOUNTER — Other Ambulatory Visit: Payer: Self-pay

## 2020-10-16 ENCOUNTER — Encounter: Payer: Self-pay | Admitting: Orthopedic Surgery

## 2020-10-16 VITALS — Ht 67.0 in | Wt 130.0 lb

## 2020-10-16 DIAGNOSIS — F419 Anxiety disorder, unspecified: Secondary | ICD-10-CM

## 2020-10-16 DIAGNOSIS — M170 Bilateral primary osteoarthritis of knee: Secondary | ICD-10-CM

## 2020-10-16 DIAGNOSIS — M25561 Pain in right knee: Secondary | ICD-10-CM

## 2020-10-16 DIAGNOSIS — M25562 Pain in left knee: Secondary | ICD-10-CM

## 2020-10-16 MED ORDER — HYDROCODONE-ACETAMINOPHEN 5-325 MG PO TABS: ORAL_TABLET | ORAL | 0 refills | Status: DC

## 2020-10-16 MED ORDER — XANAX 0.25 MG PO TABS: 0.2500 mg | ORAL_TABLET | Freq: Every evening | ORAL | 3 refills | Status: DC | PRN

## 2020-10-16 NOTE — Progress Notes (Signed)
Chief Complaint  Patient presents with  . Injections    Bilat knees   Procedure note for bilateral knee injections  Procedure note left knee injection verbal consent was obtained to inject left knee joint  Timeout was completed to confirm the site of injection  The medications used were 40 mg of Depo-Medrol and 1% lidocaine 3 cc  Anesthesia was provided by ethyl chloride and the skin was prepped with alcohol.  After cleaning the skin with alcohol a 20-gauge needle was used to inject the left knee joint. There were no complications. A sterile bandage was applied.   Procedure note right knee injection verbal consent was obtained to inject right knee joint  Timeout was completed to confirm the site of injection  The medications used were 40 mg of Depo-Medrol and 1% lidocaine 3 cc  Anesthesia was provided by ethyl chloride and the skin was prepped with alcohol.  After cleaning the skin with alcohol a 20-gauge needle was used to inject the right knee joint. There were no complications. A sterile bandage was applied.  Encounter Diagnoses  Name Primary?  . Primary osteoarthritis of both knees Yes  . Arthralgia of both lower legs   . Anxiety    Meds ordered this encounter  Medications  . HYDROcodone-acetaminophen (NORCO/VICODIN) 5-325 MG tablet    Sig: TAKE 1 TABLET BY MOUTH EVERY 8 HOURS AS NEEDED FOR MODERATE PAIN.    Dispense:  90 tablet    Refill:  0  . XANAX 0.25 MG tablet    Sig: Take 1 tablet (0.25 mg total) by mouth at bedtime as needed.    Dispense:  30 tablet    Refill:  3

## 2020-10-27 DIAGNOSIS — R262 Difficulty in walking, not elsewhere classified: Secondary | ICD-10-CM | POA: Diagnosis not present

## 2020-10-27 DIAGNOSIS — M6281 Muscle weakness (generalized): Secondary | ICD-10-CM | POA: Diagnosis not present

## 2020-11-27 DIAGNOSIS — R262 Difficulty in walking, not elsewhere classified: Secondary | ICD-10-CM | POA: Diagnosis not present

## 2020-11-27 DIAGNOSIS — M6281 Muscle weakness (generalized): Secondary | ICD-10-CM | POA: Diagnosis not present

## 2020-12-03 DIAGNOSIS — Z7189 Other specified counseling: Secondary | ICD-10-CM | POA: Diagnosis not present

## 2020-12-03 DIAGNOSIS — M159 Polyosteoarthritis, unspecified: Secondary | ICD-10-CM | POA: Diagnosis not present

## 2020-12-03 DIAGNOSIS — E785 Hyperlipidemia, unspecified: Secondary | ICD-10-CM | POA: Diagnosis not present

## 2020-12-03 DIAGNOSIS — I10 Essential (primary) hypertension: Secondary | ICD-10-CM | POA: Diagnosis not present

## 2020-12-18 ENCOUNTER — Other Ambulatory Visit: Payer: Self-pay

## 2020-12-18 ENCOUNTER — Encounter: Payer: Self-pay | Admitting: Orthopedic Surgery

## 2020-12-18 ENCOUNTER — Ambulatory Visit (INDEPENDENT_AMBULATORY_CARE_PROVIDER_SITE_OTHER): Payer: Medicare HMO | Admitting: Orthopedic Surgery

## 2020-12-18 DIAGNOSIS — M25562 Pain in left knee: Secondary | ICD-10-CM

## 2020-12-18 DIAGNOSIS — M25561 Pain in right knee: Secondary | ICD-10-CM

## 2020-12-18 DIAGNOSIS — Z7689 Persons encountering health services in other specified circumstances: Secondary | ICD-10-CM | POA: Diagnosis not present

## 2020-12-18 MED ORDER — HYDROCODONE-ACETAMINOPHEN 5-325 MG PO TABS: ORAL_TABLET | ORAL | 0 refills | Status: DC

## 2020-12-18 NOTE — Progress Notes (Signed)
Chief Complaint  Patient presents with  . Injections    Bilateral injections knee s    Procedure note for bilateral knee injections  Procedure note left knee injection verbal consent was obtained to inject left knee joint  Timeout was completed to confirm the site of injection  The medications used were 40 mg of Depo-Medrol and 1% lidocaine 3 cc  Anesthesia was provided by ethyl chloride and the skin was prepped with alcohol.  After cleaning the skin with alcohol a 20-gauge needle was used to inject the left knee joint. There were no complications. A sterile bandage was applied.   Procedure note right knee injection verbal consent was obtained to inject right knee joint  Timeout was completed to confirm the site of injection  The medications used were 40 mg of Depo-Medrol and 1% lidocaine 3 cc  Anesthesia was provided by ethyl chloride and the skin was prepped with alcohol.  After cleaning the skin with alcohol a 20-gauge needle was used to inject the right knee joint. There were no complications. A sterile bandage was applied.  Encounter Diagnosis  Name Primary?  Marland Kitchen Arthralgia of both lower legs

## 2020-12-28 DIAGNOSIS — M6281 Muscle weakness (generalized): Secondary | ICD-10-CM | POA: Diagnosis not present

## 2020-12-28 DIAGNOSIS — R262 Difficulty in walking, not elsewhere classified: Secondary | ICD-10-CM | POA: Diagnosis not present

## 2021-01-25 DIAGNOSIS — R262 Difficulty in walking, not elsewhere classified: Secondary | ICD-10-CM | POA: Diagnosis not present

## 2021-01-25 DIAGNOSIS — M6281 Muscle weakness (generalized): Secondary | ICD-10-CM | POA: Diagnosis not present

## 2021-02-17 ENCOUNTER — Ambulatory Visit: Payer: Medicare HMO | Admitting: Orthopedic Surgery

## 2021-02-24 DIAGNOSIS — M150 Primary generalized (osteo)arthritis: Secondary | ICD-10-CM | POA: Diagnosis not present

## 2021-02-24 DIAGNOSIS — I480 Paroxysmal atrial fibrillation: Secondary | ICD-10-CM | POA: Diagnosis not present

## 2021-02-24 DIAGNOSIS — I10 Essential (primary) hypertension: Secondary | ICD-10-CM | POA: Diagnosis not present

## 2021-02-24 DIAGNOSIS — E7800 Pure hypercholesterolemia, unspecified: Secondary | ICD-10-CM | POA: Diagnosis not present

## 2021-02-25 DIAGNOSIS — M6281 Muscle weakness (generalized): Secondary | ICD-10-CM | POA: Diagnosis not present

## 2021-02-25 DIAGNOSIS — R262 Difficulty in walking, not elsewhere classified: Secondary | ICD-10-CM | POA: Diagnosis not present

## 2021-03-17 ENCOUNTER — Telehealth: Payer: Self-pay | Admitting: Orthopedic Surgery

## 2021-03-17 ENCOUNTER — Ambulatory Visit: Payer: Medicare HMO | Admitting: Orthopedic Surgery

## 2021-03-17 NOTE — Telephone Encounter (Signed)
Patient son [name] called to cancel his mom's appt for today.  He is requesting Dr. Aline Brochure to call him back.  He has a couple of questions to ask him  Are the shots helping her.  It is getting more difficult to get her ready to go anywhere. She is 76 so since she can't have surgery is this something like just going thu the motions.   Please call him back at [phone].

## 2021-04-10 ENCOUNTER — Ambulatory Visit: Payer: Medicare HMO | Admitting: Orthopedic Surgery

## 2021-04-15 ENCOUNTER — Telehealth: Payer: Self-pay | Admitting: Orthopedic Surgery

## 2021-04-15 ENCOUNTER — Other Ambulatory Visit: Payer: Self-pay | Admitting: Orthopedic Surgery

## 2021-04-15 DIAGNOSIS — M25562 Pain in left knee: Secondary | ICD-10-CM

## 2021-04-15 DIAGNOSIS — M25561 Pain in right knee: Secondary | ICD-10-CM

## 2021-04-15 DIAGNOSIS — F419 Anxiety disorder, unspecified: Secondary | ICD-10-CM

## 2021-04-15 NOTE — Telephone Encounter (Signed)
Patient's son, designated contact, Ridley Dileo, ph# [phone], called to inquire whether patient may still have her pain medication refilled due to having missed her last scheduled appointment, due to having difficulty getting her out to Dr appointments. If approved, pharmacy is [facility]. Medication HYDROcodone-acetaminophen (NORCO/VICODIN) 5-325 MG tablet 90 tablet

## 2021-04-15 NOTE — Telephone Encounter (Signed)
Done

## 2021-04-17 ENCOUNTER — Other Ambulatory Visit: Payer: Self-pay | Admitting: Orthopedic Surgery

## 2021-04-17 DIAGNOSIS — M25561 Pain in right knee: Secondary | ICD-10-CM

## 2021-04-17 MED ORDER — HYDROCODONE-ACETAMINOPHEN 5-325 MG PO TABS: ORAL_TABLET | ORAL | 0 refills | Status: AC

## 2021-04-25 DIAGNOSIS — Z515 Encounter for palliative care: Secondary | ICD-10-CM | POA: Diagnosis not present

## 2021-04-25 DIAGNOSIS — E43 Unspecified severe protein-calorie malnutrition: Secondary | ICD-10-CM | POA: Diagnosis not present

## 2021-04-25 DIAGNOSIS — F0390 Unspecified dementia without behavioral disturbance: Secondary | ICD-10-CM | POA: Diagnosis not present

## 2021-05-15 DIAGNOSIS — M6281 Muscle weakness (generalized): Secondary | ICD-10-CM | POA: Diagnosis not present

## 2021-05-15 DIAGNOSIS — R262 Difficulty in walking, not elsewhere classified: Secondary | ICD-10-CM | POA: Diagnosis not present

## 2021-05-23 DIAGNOSIS — Z515 Encounter for palliative care: Secondary | ICD-10-CM | POA: Diagnosis not present

## 2021-05-23 DIAGNOSIS — F0390 Unspecified dementia without behavioral disturbance: Secondary | ICD-10-CM | POA: Diagnosis not present

## 2021-05-23 DIAGNOSIS — E43 Unspecified severe protein-calorie malnutrition: Secondary | ICD-10-CM | POA: Diagnosis not present

## 2021-06-16 DIAGNOSIS — M6281 Muscle weakness (generalized): Secondary | ICD-10-CM | POA: Diagnosis not present

## 2021-06-16 DIAGNOSIS — R262 Difficulty in walking, not elsewhere classified: Secondary | ICD-10-CM | POA: Diagnosis not present

## 2021-08-15 DIAGNOSIS — R262 Difficulty in walking, not elsewhere classified: Secondary | ICD-10-CM | POA: Diagnosis not present

## 2021-08-15 DIAGNOSIS — M6281 Muscle weakness (generalized): Secondary | ICD-10-CM | POA: Diagnosis not present

## 2021-09-14 DIAGNOSIS — M6281 Muscle weakness (generalized): Secondary | ICD-10-CM | POA: Diagnosis not present

## 2021-09-14 DIAGNOSIS — R262 Difficulty in walking, not elsewhere classified: Secondary | ICD-10-CM | POA: Diagnosis not present
